# Patient Record
Sex: Female | Born: 1935 | Race: White | Hispanic: No | Smoking: Never smoker
Health system: Southern US, Community
[De-identification: ages and names within clinical notes are randomized; demographics above are authoritative.]

## PROBLEM LIST (undated history)

## (undated) ENCOUNTER — Inpatient Hospital Stay: Admission: EM | Payer: Self-pay | Source: Home / Self Care

## (undated) DIAGNOSIS — I509 Heart failure, unspecified: Secondary | ICD-10-CM

## (undated) DIAGNOSIS — K219 Gastro-esophageal reflux disease without esophagitis: Secondary | ICD-10-CM

## (undated) DIAGNOSIS — I341 Nonrheumatic mitral (valve) prolapse: Secondary | ICD-10-CM

## (undated) DIAGNOSIS — K5790 Diverticulosis of intestine, part unspecified, without perforation or abscess without bleeding: Secondary | ICD-10-CM

## (undated) DIAGNOSIS — IMO0002 Reserved for concepts with insufficient information to code with codable children: Secondary | ICD-10-CM

## (undated) DIAGNOSIS — R600 Localized edema: Secondary | ICD-10-CM

## (undated) DIAGNOSIS — F32A Depression, unspecified: Secondary | ICD-10-CM

## (undated) DIAGNOSIS — Z9289 Personal history of other medical treatment: Secondary | ICD-10-CM

## (undated) DIAGNOSIS — M1990 Unspecified osteoarthritis, unspecified site: Secondary | ICD-10-CM

## (undated) DIAGNOSIS — Z9981 Dependence on supplemental oxygen: Secondary | ICD-10-CM

## (undated) DIAGNOSIS — H3530 Unspecified macular degeneration: Secondary | ICD-10-CM

## (undated) DIAGNOSIS — N1330 Unspecified hydronephrosis: Secondary | ICD-10-CM

## (undated) DIAGNOSIS — G4730 Sleep apnea, unspecified: Secondary | ICD-10-CM

## (undated) DIAGNOSIS — R0602 Shortness of breath: Secondary | ICD-10-CM

## (undated) DIAGNOSIS — D649 Anemia, unspecified: Secondary | ICD-10-CM

## (undated) DIAGNOSIS — Z950 Presence of cardiac pacemaker: Secondary | ICD-10-CM

## (undated) DIAGNOSIS — F329 Major depressive disorder, single episode, unspecified: Secondary | ICD-10-CM

## (undated) DIAGNOSIS — I4891 Unspecified atrial fibrillation: Secondary | ICD-10-CM

## (undated) DIAGNOSIS — H269 Unspecified cataract: Secondary | ICD-10-CM

## (undated) HISTORY — PX: CATARACT EXTRACTION W/ INTRAOCULAR LENS IMPLANT: SHX1309

## (undated) HISTORY — DX: Unspecified cataract: H26.9

## (undated) HISTORY — DX: Depression, unspecified: F32.A

## (undated) HISTORY — DX: Reserved for concepts with insufficient information to code with codable children: IMO0002

## (undated) HISTORY — DX: Gastro-esophageal reflux disease without esophagitis: K21.9

## (undated) HISTORY — DX: Anemia, unspecified: D64.9

## (undated) HISTORY — PX: TONSILLECTOMY: SUR1361

## (undated) HISTORY — DX: Diverticulosis of intestine, part unspecified, without perforation or abscess without bleeding: K57.90

## (undated) HISTORY — DX: Unspecified osteoarthritis, unspecified site: M19.90

## (undated) HISTORY — DX: Major depressive disorder, single episode, unspecified: F32.9

## (undated) HISTORY — DX: Unspecified macular degeneration: H35.30

## (undated) HISTORY — DX: Unspecified atrial fibrillation: I48.91

## (undated) HISTORY — DX: Heart failure, unspecified: I50.9

## (undated) HISTORY — DX: Nonrheumatic mitral (valve) prolapse: I34.1

---

## 2002-08-13 DIAGNOSIS — I4891 Unspecified atrial fibrillation: Secondary | ICD-10-CM

## 2002-08-13 HISTORY — DX: Unspecified atrial fibrillation: I48.91

## 2006-08-13 HISTORY — PX: PACEMAKER INSERTION: SHX728

## 2006-08-13 HISTORY — PX: KNEE ARTHROSCOPY: SUR90

## 2006-08-13 HISTORY — PX: JOINT REPLACEMENT: SHX530

## 2009-08-13 HISTORY — PX: CARDIAC VALVE REPLACEMENT: SHX585

## 2012-02-19 ENCOUNTER — Telehealth: Payer: Self-pay | Admitting: Gastroenterology

## 2012-02-19 ENCOUNTER — Other Ambulatory Visit (INDEPENDENT_AMBULATORY_CARE_PROVIDER_SITE_OTHER): Payer: Medicare Other

## 2012-02-19 ENCOUNTER — Ambulatory Visit (INDEPENDENT_AMBULATORY_CARE_PROVIDER_SITE_OTHER): Payer: Medicare Other | Admitting: Gastroenterology

## 2012-02-19 ENCOUNTER — Encounter: Payer: Self-pay | Admitting: Gastroenterology

## 2012-02-19 VITALS — BP 116/70 | HR 84 | Ht 62.0 in | Wt 214.2 lb

## 2012-02-19 DIAGNOSIS — Z5181 Encounter for therapeutic drug level monitoring: Secondary | ICD-10-CM

## 2012-02-19 DIAGNOSIS — Z952 Presence of prosthetic heart valve: Secondary | ICD-10-CM

## 2012-02-19 DIAGNOSIS — K649 Unspecified hemorrhoids: Secondary | ICD-10-CM

## 2012-02-19 DIAGNOSIS — I509 Heart failure, unspecified: Secondary | ICD-10-CM

## 2012-02-19 DIAGNOSIS — Z954 Presence of other heart-valve replacement: Secondary | ICD-10-CM

## 2012-02-19 DIAGNOSIS — T50904A Poisoning by unspecified drugs, medicaments and biological substances, undetermined, initial encounter: Secondary | ICD-10-CM

## 2012-02-19 DIAGNOSIS — D649 Anemia, unspecified: Secondary | ICD-10-CM

## 2012-02-19 DIAGNOSIS — T50905A Adverse effect of unspecified drugs, medicaments and biological substances, initial encounter: Secondary | ICD-10-CM

## 2012-02-19 DIAGNOSIS — Z8719 Personal history of other diseases of the digestive system: Secondary | ICD-10-CM

## 2012-02-19 DIAGNOSIS — R6889 Other general symptoms and signs: Secondary | ICD-10-CM

## 2012-02-19 DIAGNOSIS — Z7901 Long term (current) use of anticoagulants: Secondary | ICD-10-CM

## 2012-02-19 DIAGNOSIS — K625 Hemorrhage of anus and rectum: Secondary | ICD-10-CM

## 2012-02-19 DIAGNOSIS — I4891 Unspecified atrial fibrillation: Secondary | ICD-10-CM

## 2012-02-19 DIAGNOSIS — R1013 Epigastric pain: Secondary | ICD-10-CM

## 2012-02-19 LAB — IBC PANEL
Iron: 37 ug/dL — ABNORMAL LOW (ref 42–145)
Saturation Ratios: 7.8 % — ABNORMAL LOW (ref 20.0–50.0)
Transferrin: 338.4 mg/dL (ref 212.0–360.0)

## 2012-02-19 LAB — VITAMIN B12: Vitamin B-12: 621 pg/mL (ref 211–911)

## 2012-02-19 MED ORDER — ESOMEPRAZOLE MAGNESIUM 40 MG PO CPDR: 40.0000 mg | DELAYED_RELEASE_CAPSULE | Freq: Every day | ORAL | Status: DC

## 2012-02-19 NOTE — Patient Instructions (Addendum)
Your physician has requested that you go to the basement for the following lab work before leaving today:IBC, Ferritin, Folic Acid, Vitamin B12.  Stop taking Prilosec and start Nexium samples one tablet by mouth once daily. A prescription has been sent to your pharmacy. \ Start taking a fiber supplement such as Metamucil daily which is over the counter.    High Fiber Diet A high fiber diet changes your normal diet to include more whole grains, legumes, fruits, and vegetables. Changes in the diet involve replacing refined carbohydrates with unrefined foods. The calorie level of the diet is essentially unchanged. The Dietary Reference Intake (recommended amount) for adult males is 38 g per day. For adult females, it is 25 g per day. Pregnant and lactating women should consume 28 g of fiber per day. Fiber is the intact part of a plant that is not broken down during digestion. Functional fiber is fiber that has been isolated from the plant to provide a beneficial effect in the body. PURPOSE  Increase stool bulk.   Ease and regulate bowel movements.   Lower cholesterol.  INDICATIONS THAT YOU NEED MORE FIBER  Constipation and hemorrhoids.   Uncomplicated diverticulosis (intestine condition) and irritable bowel syndrome.   Weight management.   As a protective measure against hardening of the arteries (atherosclerosis), diabetes, and cancer.  NOTE OF CAUTION If you have a digestive or bowel problem, ask your caregiver for advice before adding high fiber foods to your diet. Some of the following medical problems are such that a high fiber diet should not be used without consulting your caregiver:  Acute diverticulitis (intestine infection).   Partial small bowel obstructions.   Complicated diverticular disease involving bleeding, rupture (perforation), or abscess (boil, furuncle).   Presence of autonomic neuropathy (nerve damage) or gastric paresis (stomach cannot empty itself).  GUIDELINES  FOR INCREASING FIBER  Start adding fiber to the diet slowly. A gradual increase of about 5 more grams (2 slices of whole-wheat bread, 2 servings of most fruits or vegetables, or 1 bowl of high fiber cereal) per day is best. Too rapid an increase in fiber may result in constipation, flatulence, and bloating.   Drink enough water and fluids to keep your urine clear or pale yellow. Water, juice, or caffeine-free drinks are recommended. Not drinking enough fluid may cause constipation.   Eat a variety of high fiber foods rather than one type of fiber.   Try to increase your intake of fiber through using high fiber foods rather than fiber pills or supplements that contain small amounts of fiber.   The goal is to change the types of food eaten. Do not supplement your present diet with high fiber foods, but replace foods in your present diet.  INCLUDE A VARIETY OF FIBER SOURCES  Replace refined and processed grains with whole grains, canned fruits with fresh fruits, and incorporate other fiber sources. White rice, white breads, and most bakery goods contain little or no fiber.   Brown whole-grain rice, buckwheat oats, and many fruits and vegetables are all good sources of fiber. These include: broccoli, Brussels sprouts, cabbage, cauliflower, beets, sweet potatoes, white potatoes (skin on), carrots, tomatoes, eggplant, squash, berries, fresh fruits, and dried fruits.   Cereals appear to be the richest source of fiber. Cereal fiber is found in whole grains and bran. Bran is the fiber-rich outer coat of cereal grain, which is largely removed in refining. In whole-grain cereals, the bran remains. In breakfast cereals, the largest amount of fiber  is found in those with "bran" in their names. The fiber content is sometimes indicated on the label.   You may need to include additional fruits and vegetables each day.   In baking, for 1 cup white flour, you may use the following substitutions:   1 cup  whole-wheat flour minus 2 tbs.    cup white flour plus  cup whole-wheat flour.  Document Released: 07/30/2005 Document Revised: 07/19/2011 Document Reviewed: 06/07/2009 [facility] Patient Information 2012 [place], [place].   cc: Philemon Kingdom, MD

## 2012-02-19 NOTE — Progress Notes (Signed)
History of Present Illness:  This is a very nice 78 year old Caucasian female accompanied by her daughter for her interview and exam. She's very complex patient with multiple cardiovascular issues, currently in uncontrolled CHF from atrial fibrillation. She is on multiple cardiac medications and multiple anticoagulants. She is referred because of some bright red blood per rectum earlier this month without rectal pain or abdominal pain. She has mild low-grade nausea related to her multiple medications, and apparently is on chronic PPI therapy for chronic acid reflux. We do not have many records for review, and she previously was cared for in [facility]. I have reviewed her medications and records otherwise in detail. The patient and her daughter do relate that she has had previous diverticulitis several years ago. She continues with lower nominal gas, bloating, bowel or regularity, and mild dyspepsia without any previous history of known peptic ulcer disease or H. pylori infection. She denies use of other NSAIDs, but does use when necessary tramadol.  She had a colonoscopy 10 years ago which apparently was unremarkable, but apparently she had a very poor prep, and this resulted in her going into atrial fibrillation. She's had a mechanical heart valve replaced at [facility] in 2008, and is followed by cardiology and aspirin [place]. As part of her recent labs she was noted to have a decreased her hemoglobin from 11.1 the 10.5. The patient currently is on iron replacement therapy, Colace, Ultram, and Anusol-HC rectal cream. She does tend to have some mild constipation with gas and bloating. There been no melanotic stools, emesis, hematemesis, or worsening dyspepsia or dysphagia.  I have reviewed this patient's present history, medical and surgical past history, allergies and medications.     ROS: The remainder of the 10 point ROS is negative... chronic congestive heart failure with  marked limitations in her ambulation, and she can barely walk to the car without being short of breath.     Physical Exam: Elderly appearing patient in no acute distress. Blood pressure 116/70, pulse 84 and irregular, and weight 214 pounds the BMI of 39.19. General well developed well nourished patient in no acute distress,  Eyes PERRLA, no icterus, fundoscopic exam per opthamologist Skin no lesions noted Neck supple, no adenopathy, no thyroid enlargement, no tenderness Chest clear to percussion and auscultation Heart no significant murmurs, gallops or rubs noted.. irregularly irregular heartbeat noted. Abdomen no hepatosplenomegaly masses or tenderness, BS normal. Obese abdomen noted. Rectal inspection normal no fissures, or fistulae noted.  No masses or tenderness on digital exam. Stool guaiac negative. External hemorrhoids present with stigmata of recent bleeding posteriorly with a superficial black blood clot. Rectal exam otherwise negative with guaiac-negative stool. Extremities no acute joint lesions, edema, phlebitis or evidence of cellulitis... +1 pitting edema bilaterally noted. Neurologic patient oriented x 3, cranial nerves intact, no focal neurologic deficits noted. Psychological mental status normal and normal affect. Mild memory impairment obvious.  Assessment and plan: Severely ill patient from a cardiovascular standpoint,  not a candidate for endoscopic procedures at this time. She appears to have had hemorrhoidal bleeding, and I suspect she has diverticulosis coli with associated constipation, gas and bloating. She is chronically on PPI therapy, and has mild dyspepsia. I have changed her to Nexium 40 mg a day because of her high risk of upper GI bleeding with aspirin and other anticoagulants, have placed her on high fiber diet with daily Metamucil, and we will continue local hemorrhoidal creams. If she has further bleeding, she will  probably need CT colonoscopy once her  cardiovascular status and her atrial fibrillation has improved. As mentioned above she has a mechanical heart valve in place. I did order repeat CBC and anemia profile. Complex case with multiple problems and decision making and chart review.  Encounter Diagnoses  Name Primary?  . Rectal bleeding Yes  . Hemorrhoids   . Other general symptoms

## 2012-02-20 MED ORDER — LANSOPRAZOLE 30 MG PO CPDR: 30.0000 mg | DELAYED_RELEASE_CAPSULE | Freq: Every day | ORAL | Status: DC

## 2012-02-20 NOTE — Addendum Note (Signed)
Addended by: Annett Fabian on: 02/20/2012 05:26 PM   Modules accepted: Orders

## 2012-02-20 NOTE — Telephone Encounter (Signed)
Patient is in the donut hole and is having to pay for her Nexium.  Her daughter will contact her plan and the pharmacy to see what PPi is the cheapest for her and call back

## 2012-02-20 NOTE — Telephone Encounter (Signed)
Patient's daughter called back.  I have called in lansoprazole.  She will call back for a 3 month supply if this controls her symptoms

## 2012-02-21 LAB — FERRITIN: Ferritin: 54.2 ng/mL (ref 10.0–291.0)

## 2012-02-22 ENCOUNTER — Other Ambulatory Visit: Payer: Self-pay | Admitting: *Deleted

## 2012-02-22 ENCOUNTER — Other Ambulatory Visit: Payer: Self-pay

## 2012-02-22 MED ORDER — FERROUS SULFATE 325 (65 FE) MG PO TABS: 325.0000 mg | ORAL_TABLET | Freq: Every day | ORAL | Status: DC

## 2012-02-22 MED ORDER — FERROUS FUM-IRON POLYSACCH-FA 162-115.2-1 MG PO CAPS: 1.0000 | ORAL_CAPSULE | Freq: Every day | ORAL | Status: DC

## 2012-02-25 ENCOUNTER — Telehealth: Payer: Self-pay | Admitting: Gastroenterology

## 2012-02-25 NOTE — Telephone Encounter (Signed)
Daughter called to report only ferrous sulfate was ordered and pt has been taking 2 daily. Called [facility] and they had no record of my order; gave a verbal order to [facility] for Tandem; they never received my order. Informed daughter.

## 2012-06-13 ENCOUNTER — Telehealth: Payer: Self-pay | Admitting: *Deleted

## 2012-06-13 DIAGNOSIS — D649 Anemia, unspecified: Secondary | ICD-10-CM

## 2012-06-13 NOTE — Telephone Encounter (Signed)
Message copied by Florene Glen on Fri Jun 13, 2012  1:50 PM ------      Message from: Florene Glen      Created: Fri Feb 22, 2012 11:11 AM       Ck labs in 3 months for response to tandem

## 2012-06-13 NOTE — Telephone Encounter (Signed)
Informed pt she needs to repeat her labs since she has been taking Tandem. Gave pt the lab location and hours and she will hopefully come next week.

## 2012-06-17 ENCOUNTER — Telehealth: Payer: Self-pay | Admitting: Gastroenterology

## 2012-06-17 NOTE — Telephone Encounter (Signed)
Informed pt she does not need any more labs per Dr Jarold Motto; pt stated understanding.

## 2012-06-17 NOTE — Telephone Encounter (Signed)
Dr Jarold Motto, I had called pt a couple of weeks ago to repeat labs after Tandem use of 3 months. I had labs faxed from PCP and CBC and CMET is all that was drawn. I do not have a Baseline CBC. Are these labs enough or do you still wants an Iron Panel done? Thanks.   Lab results are on your desk. Thanks.

## 2012-06-17 NOTE — Telephone Encounter (Signed)
Pt report she had her labs done recently at her PCP and the doctor stated everything was fine. Called Dr Rayfield [facility] in [place], [phone], and they will fax the results.

## 2012-06-17 NOTE — Telephone Encounter (Signed)
Labs are fine does not need further blood work.

## 2012-08-13 DIAGNOSIS — D649 Anemia, unspecified: Secondary | ICD-10-CM

## 2012-08-13 HISTORY — DX: Anemia, unspecified: D64.9

## 2014-01-20 ENCOUNTER — Other Ambulatory Visit: Payer: Self-pay | Admitting: Urology

## 2014-01-21 ENCOUNTER — Encounter (HOSPITAL_COMMUNITY): Payer: Self-pay | Admitting: Pharmacy Technician

## 2014-01-21 ENCOUNTER — Other Ambulatory Visit: Payer: Self-pay | Admitting: Urology

## 2014-01-21 DIAGNOSIS — N135 Crossing vessel and stricture of ureter without hydronephrosis: Secondary | ICD-10-CM

## 2014-01-21 DIAGNOSIS — R591 Generalized enlarged lymph nodes: Secondary | ICD-10-CM

## 2014-01-25 ENCOUNTER — Other Ambulatory Visit: Payer: Self-pay | Admitting: Radiology

## 2014-01-25 ENCOUNTER — Other Ambulatory Visit (HOSPITAL_COMMUNITY): Payer: Self-pay | Admitting: Urology

## 2014-01-25 ENCOUNTER — Encounter (HOSPITAL_COMMUNITY): Payer: Self-pay

## 2014-01-25 DIAGNOSIS — R600 Localized edema: Secondary | ICD-10-CM

## 2014-01-25 HISTORY — DX: Localized edema: R60.0

## 2014-01-25 NOTE — Patient Instructions (Addendum)
Your procedure is scheduled on:  01/28/14  THURSDAY  Report to [place] at    0700   AM.   Call this number if you have problems the morning of surgery: [place]  Do not eat food  Or drink :After Midnight. WEDNESDAY   Take these medicines the morning of surgery with A SIP OF WATER:AMIODARONE, LEXAPRO, NEXIUM, MIRAPEX May take NORCO/ ATIVAN/ TRAMADOL IF NEEDED(not all at the same time!)   .  Contacts, dentures or partial plates, or metal hairpins  can not be worn to surgery. Your family will be responsible for glasses, dentures, hearing aides while you are in surgery  Leave suitcase in the car. After surgery it may be brought to your room.  For patients admitted to the hospital, checkout time is 11:00 AM day of  discharge.         [facility] IS NOT RESPONSIBLE FOR ANY VALUABLES                                                                  [facility] - Preparing for Surgery Before surgery, you can play an important role.  Because skin is not sterile, your skin needs to be as free of germs as possible.  You can reduce the number of germs on your skin by washing with CHG (chlorahexidine gluconate) soap before surgery.  CHG is an antiseptic cleaner which kills germs and bonds with the skin to continue killing germs even after washing. Please DO NOT use if you have an allergy to CHG or antibacterial soaps.  If your skin becomes reddened/irritated stop using the CHG and inform your nurse when you arrive at Short Stay. Do not shave (including legs and underarms) for at least 48 hours prior to the first CHG shower.  You may shave your face/neck. Please follow these instructions carefully:  1.  Shower with CHG Soap the night before surgery and the  morning of Surgery.  2.  If you choose to wash your hair, wash your hair first as usual with your  normal   shampoo.  3.  After you shampoo, rinse your hair and body thoroughly to remove the  shampoo.                           4.  Use CHG as you would any other liquid soap.  You can apply chg directly  to the skin and wash                       Gently with a scrungie or clean washcloth.  5.  Apply the CHG Soap to your body ONLY FROM THE NECK DOWN.   Do not use on face/ open                           Wound or open sores. Avoid contact with eyes, ears mouth and genitals (private parts).  Wash face,  Genitals (private parts) with your normal soap.             6.  Wash thoroughly, paying special attention to the area where your surgery  will be performed.  7.  Thoroughly rinse your body with warm water from the neck down.  8.  DO NOT shower/wash with your normal soap after using and rinsing off  the CHG Soap.                9.  Pat yourself dry with a clean towel.            10.  Wear clean pajamas.            11.  Place clean sheets on your bed the night of your first shower and do not  sleep with pets. Day of Surgery : Do not apply any lotions/deodorants the morning of surgery.  Please wear clean clothes to the hospital/surgery center.  FAILURE TO FOLLOW THESE INSTRUCTIONS MAY RESULT IN THE CANCELLATION OF YOUR SURGERY PATIENT SIGNATURE_________________________________  NURSE SIGNATURE__________________________________  ________________________________________________________________________  PT NOTIFED SURGERY DATE MOVED TO Tuesday 02/02/14 - SHE SHOULD ARRIVE TO SHORT STAY BY 9:30 AM - SURGERY IS AT 11:30 AM.  ALL OTHER INSTRUCTIONS THE SAME - TAKE MEDICATIONS AS PREVIOUSLY INSTRUCTED AM OF SURGERY WITH FEW SIPS WATER.  PT VOICED UNDERSTANDING  PAT [facility] RN 01/29/14

## 2014-01-25 NOTE — Progress Notes (Signed)
LOV Dr Agustin Cree 3/15 chart, last pacer interrogation 5/15 chart, ekg 3/15 chart, eccho 4/15 chart

## 2014-01-25 NOTE — Progress Notes (Signed)
Lennette Bihari-  I will be seeing Ms Portner for pre op tomorrow in PST.  If you will place order for consent in epic before then, I will be happy to obtain this before the day of procedure following anesthesia   Thank you

## 2014-01-25 NOTE — Progress Notes (Signed)
Ov Dr Laqueta Due 5/15 on chart

## 2014-01-26 ENCOUNTER — Encounter (HOSPITAL_COMMUNITY): Payer: Self-pay

## 2014-01-26 ENCOUNTER — Inpatient Hospital Stay (HOSPITAL_COMMUNITY)
Admission: RE | Admit: 2014-01-26 | Discharge: 2014-01-26 | Disposition: A | Discharge status: Home / Self Care | Payer: Medicare Other | Source: Ambulatory Visit

## 2014-01-26 ENCOUNTER — Ambulatory Visit (HOSPITAL_COMMUNITY)
Admission: RE | Admit: 2014-01-26 | Discharge: 2014-01-26 | Disposition: A | Discharge status: Home / Self Care | Payer: Medicare Other | Source: Ambulatory Visit | Attending: Anesthesiology | Admitting: Anesthesiology

## 2014-01-26 ENCOUNTER — Encounter (HOSPITAL_COMMUNITY)
Admission: RE | Admit: 2014-01-26 | Discharge: 2014-01-26 | Disposition: A | Discharge status: Home / Self Care | Payer: Medicare Other | Source: Ambulatory Visit | Attending: Urology | Admitting: Urology

## 2014-01-26 DIAGNOSIS — Z01812 Encounter for preprocedural laboratory examination: Secondary | ICD-10-CM | POA: Insufficient documentation

## 2014-01-26 DIAGNOSIS — I4891 Unspecified atrial fibrillation: Secondary | ICD-10-CM | POA: Insufficient documentation

## 2014-01-26 DIAGNOSIS — Z950 Presence of cardiac pacemaker: Secondary | ICD-10-CM | POA: Insufficient documentation

## 2014-01-26 DIAGNOSIS — Z954 Presence of other heart-valve replacement: Secondary | ICD-10-CM | POA: Insufficient documentation

## 2014-01-26 DIAGNOSIS — Z01818 Encounter for other preprocedural examination: Secondary | ICD-10-CM | POA: Insufficient documentation

## 2014-01-26 HISTORY — DX: Shortness of breath: R06.02

## 2014-01-26 HISTORY — DX: Personal history of other medical treatment: Z92.89

## 2014-01-26 HISTORY — DX: Sleep apnea, unspecified: G47.30

## 2014-01-26 HISTORY — DX: Presence of cardiac pacemaker: Z95.0

## 2014-01-26 HISTORY — DX: Localized edema: R60.0

## 2014-01-26 HISTORY — DX: Unspecified hydronephrosis: N13.30

## 2014-01-26 LAB — CBC
HCT: 38.4 % (ref 36.0–46.0)
Hemoglobin: 12.1 g/dL (ref 12.0–15.0)
MCH: 28.3 pg (ref 26.0–34.0)
MCHC: 31.5 g/dL (ref 30.0–36.0)
MCV: 89.9 fL (ref 78.0–100.0)
PLATELETS: 220 10*3/uL (ref 150–400)
RBC: 4.27 MIL/uL (ref 3.87–5.11)
RDW: 14.7 % (ref 11.5–15.5)
WBC: 6.3 10*3/uL (ref 4.0–10.5)

## 2014-01-26 LAB — BASIC METABOLIC PANEL
BUN: 20 mg/dL (ref 6–23)
CALCIUM: 9.3 mg/dL (ref 8.4–10.5)
CO2: 28 meq/L (ref 19–32)
CREATININE: 1.35 mg/dL — AB (ref 0.50–1.10)
Chloride: 104 mEq/L (ref 96–112)
GFR calc non Af Amer: 36 mL/min — ABNORMAL LOW (ref >90)
GFR, EST AFRICAN AMERICAN: 41 mL/min — AB (ref >90)
Glucose, Bld: 95 mg/dL (ref 70–99)
Potassium: 4.4 mEq/L (ref 3.7–5.3)
SODIUM: 142 meq/L (ref 137–147)

## 2014-01-26 LAB — PROTIME-INR
INR: 3.98 — ABNORMAL HIGH (ref 0.00–1.49)
Prothrombin Time: 37.3 seconds — ABNORMAL HIGH (ref 11.6–15.2)

## 2014-01-26 LAB — APTT: APTT: 66 s — AB (ref 24–37)

## 2014-01-26 NOTE — H&P (Signed)
son For Visit Seen today for complaint of difficulty voiding and worsening (L) LE.   Active Problems Problems  1. Difficulty in urination (788.99) 2. Hydronephrosis, left (591)   Assessed By: Jimmey Ralph (Urology); Last Assessed: 19 Jan 2014 3. Lymphadenopathy (785.6)   Assessed By: Jimmey Ralph (Urology); Last Assessed: 19 Jan 2014 4. Ureteral obstruction (593.4)  History of Present Illness 80 YO female patient of Dr. Ralene Muskrat seen today for complaint of difficulty voiding and worsening (L) LE.     GU HX:     Last seen 01/12/14 for consultation by Dr. Laqueta Due for left hydronephrosis. She has a 2 month history of edema in the left leg and a CT was done to look for an intraabdominal cause and she was found to have changes along the left ureter that appeared inflammatory and there was obstruction of the left kidney that was not present on a prior scan in 2012. There was cortical thinning suggesting a long standing process and there were some enlarged node in the inguinal area. She has some fluid in the uterine cavity and has seen gynecology for attempted endometrial biopsy but this was unsuccessful due to severe cervical stenosis.  She has had hematuria. She began to have some pain in the left flank that was mild but has progressed over the last 2 weeks. She has a catch in the left groin. She has had no fever or night sweats.     Interval HX;   Today left leg is much more edematous and "tight". No weeping. States she has also noted a decrease in urine output but states that over past 36 hrs has not drank a lot of fluids and today has not taken Fursomeide due to "not being home."   Past Medical History Problems  1. History of Anxiety (300.00) 2. History of CPAP (continuous positive airway pressure) dependence (V46.8) 3. History of arthritis (V13.4) 4. History of atrial fibrillation (V12.59) 5. History of cardiac arrhythmia (V12.59) 6. History of cardiac murmur (V12.59) 7.  History of depression (V11.8) 8. History of esophageal reflux (V12.79) 9. History of heart failure (V12.59) 10. History of Obstructive sleep apnea, adult (327.23)  Surgical History Problems  1. History of Blepharoplasty Upper Lid W/ Excessive Skin 2. History of Cataract Surgery 3. History of Knee Replacement 4. History of Mitral Valve Replacement 5. History of Pacemaker Placement 6. History of Sinus Surgery 7. History of Tonsillectomy With Adenoidectomy  Current Meds 1. Anusol-HC 25 MG Rectal Suppository;  Therapy: (Recorded:02Jun2015) to Recorded 2. Coumadin TABS;  Therapy: (Recorded:02Jun2015) to Recorded 3. Hydrocodone-Acetaminophen TABS;  Therapy: (GSUPJSRP:59YVO5929) to Recorded 4. Iron TABS;  Therapy: (WKMQKMMN:81RRN1657) to Recorded 5. Lasix 40 MG Oral Tablet;  Therapy: (XUXYBFXO:32NVB1660) to Recorded 6. Lexapro 20 MG Oral Tablet;  Therapy: (AYOKHTXH:74FSE3953) to Recorded 7. Magnesium 500 MG Oral Tablet;  Therapy: (UYEBXIDH:68SHU8372) to Recorded 8. Mirapex 0.5 MG Oral Tablet;  Therapy: (BMSXJDBZ:20EYE2336) to Recorded 9. NexIUM 40 MG Oral Capsule Delayed Release;  Therapy: (PQAESLPN:30YFR1021) to Recorded 10. Pacerone 200 MG Oral Tablet;   Therapy: (RZNBVAPO:14DCV0131) to Recorded 11. Potassium Chloride 20 MEQ TBCR;   Therapy: (YHOOILNZ:97KQA0601) to Recorded 12. SEROquel 25 MG Oral Tablet;   Therapy: (VIFBPPHK:32XMD4709) to Recorded 13. Stool Softener TABS;   Therapy: (KHVFMBBU:03JQD6438) to Recorded 14. TraMADol HCl TABS;   Therapy: (VKFMMCRF:54HKG6770) to Recorded 15. Tylenol Arthritis Pain 650 MG Oral Tablet Extended Release;   Therapy: (HEKBTCYE:18HTM9311) to Recorded 16. Vitamin D3 CAPS;   Therapy: (Recorded:02Jun2015) to Recorded  Allergies Medication  1. Gabapentin CAPS  2. Wellbutrin TABS  Family History Problems  1. Family history of congestive heart failure (V17.49) : Mother 2. Family history of depression (V17.0) 3. Family history of  pancreatic cancer (V16.0) : Father  Social History Problems  1. Denied: History of Alcohol use 2. Caffeine use (V49.89) 3. Never a smoker 4. Number of children   3 daughters    2 sons 5. Retired 6. Widowed  Review of Systems Genitourinary, constitutional, skin, eye, otolaryngeal, hematologic/lymphatic, cardiovascular, pulmonary, endocrine, musculoskeletal, gastrointestinal, neurological and psychiatric system(s) were reviewed and pertinent findings if present are noted.  Genitourinary: oliguria.  Cardiovascular: leg swelling.    Vitals Vital Signs [Data Includes: Last 1 Day]  Recorded: 09Jun2015 01:30PM  Blood Pressure: 132 / 69 Temperature: 97.7 F Heart Rate: 81  Physical Exam Constitutional: Well nourished and well developed . No acute distress. The patient appears well hydrated.  LLE +3/4 left leg +2 right leg  Abdomen: The abdomen is obese. The abdomen is soft and nontender. No CVA tenderness.  Skin: Normal skin turgor and normal skin color and pigmentation.  Neuro/Psych:. Mood and affect are appropriate.    Results/Data Urine [Data Includes: Last 1 Day]   09Jun2015  COLOR YELLOW   APPEARANCE CLEAR   SPECIFIC GRAVITY 1.025   pH 5.5   GLUCOSE NEG mg/dL  BILIRUBIN NEG   KETONE TRACE mg/dL  BLOOD NEG   PROTEIN TRACE mg/dL  UROBILINOGEN 0.2 mg/dL  NITRITE NEG   LEUKOCYTE ESTERASE SMALL   SQUAMOUS EPITHELIAL/HPF MODERATE   WBC 0-2 WBC/hpf  RBC 0-2 RBC/hpf  BACTERIA MODERATE   CRYSTALS NONE SEEN   CASTS NONE SEEN   Other AMORPHOUS NOTED    The following clinical lab reports were reviewed:  UA- negative Stat BUN/creatine.  PVR: Ultrasound PVR 22 ml. Selected Results  BUN & CREATININE 09Jun2015 02:06PM Warden, Diane  SPECIMEN TYPE: BLOOD   Test Name Result Flag Reference  CREATININE 1.50 mg/dL H 0.50-1.40  BUN 23 mg/dL  6-23  Est GFR, African American 37 mL/min L   Est GFR, NonAfrican American 32 mL/min L   PERFORMED AT:        [facility]                       [address]                      [place], [place] [address]   THE ESTIMATED GFR IS A CALCULATION VALID FOR ADULTS (>=18 YEARS OLD) THAT USES THE CKD-EPI ALGORITHM TO ADJUST FOR AGE AND SEX. IT IS   NOT TO BE USED FOR CHILDREN, PREGNANT WOMEN, HOSPITALIZED PATIENTS,    PATIENTS ON DIALYSIS, OR WITH RAPIDLY CHANGING KIDNEY FUNCTION. ACCORDING TO THE NKDEP, EGFR >89 IS NORMAL, 60-89 SHOWS MILD IMPAIRMENT, 30-59 SHOWS MODERATE IMPAIRMENT, 15-29 SHOWS SEVERE IMPAIRMENT AND <15 IS ESRD.   Assessment Assessed  1. Lymphadenopathy (785.6) 2. Hydronephrosis, left (591) 3. Difficulty in urination (788.99) 4. Ureteral obstruction (593.4)  Plan  Difficulty in urination  1. PVR U/S; Status:Complete;   Done: 09Jun2015 Health Maintenance  2. UA With REFLEX; [Do Not Release]; Status:Complete;   Done: 09Jun2015 01:17PM Hydronephrosis, left, Lymphadenopathy  3. VENIPUNCTURE; Status:Complete;   Done: 09Jun2015  Stat BUN/creatine   Instructed to take diuretic daily and to keep leg elevated as much as possible  F/U next week with Dr. Wrenn to discuss proceeding with cysto/stent      BUN & CREATININE; Status:Resulted - Requires   Verification;  Done: [id number] 12:00AM  Due:11Jun2015; Marked Important;Ordered; Stat;   YQI:[id number], left, Lymphadenopathy; Ordered EP:PIRJJO, Diane;   Electronics engineer signed by : Jimmey Ralph, Trey Paula; Jan 19 2014  2:42PM EST

## 2014-01-27 ENCOUNTER — Other Ambulatory Visit: Payer: Self-pay | Admitting: Urology

## 2014-01-27 NOTE — Progress Notes (Signed)
Per epic, labs had been reviewed by K Allred PA 01/26/14

## 2014-01-27 NOTE — Progress Notes (Signed)
PST NOTE--- LATE ENTRY FOR 01/26/14  1600 Patient states she will be going to cardiologist office in [place] after she leaves here for lab draw and talk about anticoagulation.   I spoke with office at 1400 who stated they would send pacer orders, notes, lovonox instructions and more information regarding type of pacemaker, leads, company etc after her visit today.  01/28/14 1200- spoke with Dr Jennings Books office requesting info to be faxed from visit 01/26/14.  Stated patient was there now, and that her INR was even higher and she would not be able to have her surgery.  Office stated they would call [facility] and notify them.  1230  Left VM with Pam at Dr Denton Lank office clarififying she had received this.  1330 spoke with Pam who stated that surgery cancelled for tomorrow and will be rescheduled for 02/02/14.   Cardiology office stated they will handle her coagulation.

## 2014-01-28 ENCOUNTER — Other Ambulatory Visit: Payer: Self-pay | Admitting: Radiology

## 2014-01-28 ENCOUNTER — Ambulatory Visit (HOSPITAL_COMMUNITY): Admission: RE | Admit: 2014-01-28 | Payer: Medicare Other | Source: Ambulatory Visit | Admitting: Urology

## 2014-01-28 ENCOUNTER — Encounter (HOSPITAL_COMMUNITY): Admission: RE | Payer: Self-pay | Source: Ambulatory Visit

## 2014-01-28 ENCOUNTER — Ambulatory Visit (HOSPITAL_COMMUNITY): Payer: Medicare Other

## 2014-01-28 SURGERY — CYSTOURETEROSCOPY, WITH RETROGRADE PYELOGRAM AND STENT INSERTION
Anesthesia: Choice | Laterality: Left

## 2014-01-29 NOTE — Progress Notes (Signed)
I SPOKE WITH PT BY PHONE THIS AM - SHE STATES HER CARDIOLOGIST INSTRUCTED HER TO HOLD HER COUMADIN TODAY AND TOMORROW ( Friday AND Saturday ) AND GO TO ER Sunday FOR BLOOD DRAW AND IF 2 OR LOWER ( INR ) SHE WILL DO LOVENOX Sunday NIGHT AND Monday AM.  IF HER BLOOD IS OVER 2 - SHE WILL SEE HER HEART DOCTOR Monday AM.   PT'S SURGERY FOR 6/18 WAS CANCELLED AND SHE WAS PUT ON FOR SAME SURGERY ON Tuesday 02/02/14.  I INSTRUCTED PT TO ARRIVE TO SHORT STAY AT 9:30 AM Tuesday - SURGERY TIME IS 11:30 AM. PT REMINDED NOTHING TO EAT OR DRINK AFTER MIDNIGHT THE NIGHT BEFORE HER SURGERY - EXCEPT FEW SIPS OF WATER TO TAKE THE MEDICATIONS AS PER INSTRUCTIONS SHE RECEIVED AT [facility] APPOINTMENT 6/16 AT [facility].  PT WAS ASKED TO BE SURE TO LET NURSE KNOW AM OF SURGERY IF SHE DID TAKE LOVENOX AND WHAT DOSAGE - SO NURSE COULD RECORD IN MEDICATION RECORD.  PT REMINDED TO FOLLOW ALL OTHER PREOP INSTRUCTIONS GIVEN ON 6/16 - SHE STILL HAS THE PAPERS. 3RD REQUEST WAS MADE TO PT'S CARDIOLOGIST'S OFFICE FOR PACEMAKER ORDERS TO BE COMPLETED AND FAXED BACK.  HER OFFICE NOTE FROM HER VISIT 01/26/14 WITH CARDIOLOGIST REQUESTED.

## 2014-02-01 ENCOUNTER — Other Ambulatory Visit: Payer: Self-pay | Admitting: Radiology

## 2014-02-02 ENCOUNTER — Encounter (HOSPITAL_COMMUNITY): Payer: Medicare Other | Admitting: Anesthesiology

## 2014-02-02 ENCOUNTER — Encounter (HOSPITAL_COMMUNITY): Payer: Self-pay | Admitting: *Deleted

## 2014-02-02 ENCOUNTER — Encounter (HOSPITAL_COMMUNITY): Payer: Self-pay

## 2014-02-02 ENCOUNTER — Encounter (HOSPITAL_COMMUNITY): Payer: Self-pay | Admitting: Anesthesiology

## 2014-02-02 ENCOUNTER — Ambulatory Visit (HOSPITAL_COMMUNITY): Payer: Medicare Other | Admitting: Anesthesiology

## 2014-02-02 ENCOUNTER — Encounter (HOSPITAL_COMMUNITY)
Admission: RE | Disposition: A | Discharge status: Home / Self Care | Payer: Self-pay | Source: Ambulatory Visit | Attending: Urology

## 2014-02-02 ENCOUNTER — Ambulatory Visit (HOSPITAL_COMMUNITY)
Admission: RE | Admit: 2014-02-02 | Discharge: 2014-02-02 | Disposition: A | Discharge status: Home / Self Care | Payer: Medicare Other | Source: Ambulatory Visit | Attending: Urology | Admitting: Urology

## 2014-02-02 VITALS — BP 120/54 | HR 65 | Temp 97.3°F | Resp 14

## 2014-02-02 DIAGNOSIS — G4733 Obstructive sleep apnea (adult) (pediatric): Secondary | ICD-10-CM | POA: Insufficient documentation

## 2014-02-02 DIAGNOSIS — N1330 Unspecified hydronephrosis: Secondary | ICD-10-CM | POA: Insufficient documentation

## 2014-02-02 DIAGNOSIS — K5730 Diverticulosis of large intestine without perforation or abscess without bleeding: Secondary | ICD-10-CM | POA: Insufficient documentation

## 2014-02-02 DIAGNOSIS — R591 Generalized enlarged lymph nodes: Secondary | ICD-10-CM

## 2014-02-02 DIAGNOSIS — F329 Major depressive disorder, single episode, unspecified: Secondary | ICD-10-CM | POA: Insufficient documentation

## 2014-02-02 DIAGNOSIS — I499 Cardiac arrhythmia, unspecified: Secondary | ICD-10-CM | POA: Insufficient documentation

## 2014-02-02 DIAGNOSIS — N135 Crossing vessel and stricture of ureter without hydronephrosis: Secondary | ICD-10-CM | POA: Diagnosis present

## 2014-02-02 DIAGNOSIS — Z950 Presence of cardiac pacemaker: Secondary | ICD-10-CM | POA: Insufficient documentation

## 2014-02-02 DIAGNOSIS — D649 Anemia, unspecified: Secondary | ICD-10-CM | POA: Insufficient documentation

## 2014-02-02 DIAGNOSIS — I509 Heart failure, unspecified: Secondary | ICD-10-CM | POA: Insufficient documentation

## 2014-02-02 DIAGNOSIS — R3989 Other symptoms and signs involving the genitourinary system: Secondary | ICD-10-CM | POA: Insufficient documentation

## 2014-02-02 DIAGNOSIS — R599 Enlarged lymph nodes, unspecified: Secondary | ICD-10-CM | POA: Insufficient documentation

## 2014-02-02 DIAGNOSIS — K219 Gastro-esophageal reflux disease without esophagitis: Secondary | ICD-10-CM | POA: Insufficient documentation

## 2014-02-02 DIAGNOSIS — C779 Secondary and unspecified malignant neoplasm of lymph node, unspecified: Secondary | ICD-10-CM | POA: Insufficient documentation

## 2014-02-02 DIAGNOSIS — Z79899 Other long term (current) drug therapy: Secondary | ICD-10-CM | POA: Insufficient documentation

## 2014-02-02 DIAGNOSIS — R319 Hematuria, unspecified: Secondary | ICD-10-CM | POA: Insufficient documentation

## 2014-02-02 DIAGNOSIS — F3289 Other specified depressive episodes: Secondary | ICD-10-CM | POA: Insufficient documentation

## 2014-02-02 DIAGNOSIS — I4891 Unspecified atrial fibrillation: Secondary | ICD-10-CM | POA: Insufficient documentation

## 2014-02-02 DIAGNOSIS — F411 Generalized anxiety disorder: Secondary | ICD-10-CM | POA: Insufficient documentation

## 2014-02-02 DIAGNOSIS — N289 Disorder of kidney and ureter, unspecified: Secondary | ICD-10-CM | POA: Insufficient documentation

## 2014-02-02 HISTORY — PX: CYSTOSCOPY WITH RETROGRADE PYELOGRAM, URETEROSCOPY AND STENT PLACEMENT: SHX5789

## 2014-02-02 LAB — PROTIME-INR
INR: 1.22 (ref 0.00–1.49)
Prothrombin Time: 15.1 seconds (ref 11.6–15.2)

## 2014-02-02 SURGERY — CYSTOURETEROSCOPY, WITH RETROGRADE PYELOGRAM AND STENT INSERTION
Anesthesia: General | Site: Ureter | Laterality: Left

## 2014-02-02 MED ORDER — MIDAZOLAM HCL 2 MG/2ML IJ SOLN
INTRAMUSCULAR | Status: AC | PRN
  Administered 2014-02-02: 2 mg via INTRAVENOUS

## 2014-02-02 MED ORDER — SODIUM CHLORIDE 0.9 % IR SOLN
Status: DC | PRN
  Administered 2014-02-02: 3000 mL

## 2014-02-02 MED ORDER — LIDOCAINE HCL (CARDIAC) 20 MG/ML IV SOLN
INTRAVENOUS | Status: DC | PRN
  Administered 2014-02-02: 30 mg via INTRAVENOUS

## 2014-02-02 MED ORDER — FENTANYL CITRATE 0.05 MG/ML IJ SOLN
INTRAMUSCULAR | Status: AC
  Filled 2014-02-02: qty 2

## 2014-02-02 MED ORDER — ACETAMINOPHEN 325 MG PO TABS: 650.0000 mg | ORAL_TABLET | ORAL | Status: DC | PRN

## 2014-02-02 MED ORDER — SODIUM CHLORIDE 0.9 % IV SOLN: 250.0000 mL | INTRAVENOUS | Status: DC | PRN

## 2014-02-02 MED ORDER — CIPROFLOXACIN IN D5W 400 MG/200ML IV SOLN
400.0000 mg | INTRAVENOUS | Status: AC
  Administered 2014-02-02: 400 mg via INTRAVENOUS

## 2014-02-02 MED ORDER — SODIUM CHLORIDE 0.9 % IV SOLN: INTRAVENOUS | Status: DC

## 2014-02-02 MED ORDER — PROPOFOL 10 MG/ML IV BOLUS
INTRAVENOUS | Status: AC
  Filled 2014-02-02: qty 20

## 2014-02-02 MED ORDER — IOHEXOL 300 MG/ML  SOLN
INTRAMUSCULAR | Status: DC | PRN
  Administered 2014-02-02: 4 mL via URETHRAL

## 2014-02-02 MED ORDER — SODIUM CHLORIDE 0.9 % IJ SOLN: 3.0000 mL | Freq: Two times a day (BID) | INTRAMUSCULAR | Status: DC

## 2014-02-02 MED ORDER — FENTANYL CITRATE 0.05 MG/ML IJ SOLN
INTRAMUSCULAR | Status: AC | PRN
  Administered 2014-02-02: 100 ug via INTRAVENOUS

## 2014-02-02 MED ORDER — LACTATED RINGERS IV SOLN
INTRAVENOUS | Status: DC
  Administered 2014-02-02: 1000 mL via INTRAVENOUS

## 2014-02-02 MED ORDER — OXYCODONE HCL 5 MG PO TABS: 5.0000 mg | ORAL_TABLET | ORAL | Status: DC | PRN

## 2014-02-02 MED ORDER — FENTANYL CITRATE 0.05 MG/ML IJ SOLN: 25.0000 ug | INTRAMUSCULAR | Status: DC | PRN

## 2014-02-02 MED ORDER — NALOXONE HCL 0.4 MG/ML IJ SOLN
INTRAMUSCULAR | Status: AC | PRN
  Administered 2014-02-02: 0.2 mg via INTRAVENOUS

## 2014-02-02 MED ORDER — FENTANYL CITRATE 0.05 MG/ML IJ SOLN
INTRAMUSCULAR | Status: AC
  Filled 2014-02-02: qty 4

## 2014-02-02 MED ORDER — ACETAMINOPHEN 650 MG RE SUPP
650.0000 mg | RECTAL | Status: DC | PRN
  Filled 2014-02-02: qty 1

## 2014-02-02 MED ORDER — PROPOFOL 10 MG/ML IV BOLUS
INTRAVENOUS | Status: DC | PRN
  Administered 2014-02-02 (×2): 25 mg via INTRAVENOUS
  Administered 2014-02-02: 175 mg via INTRAVENOUS

## 2014-02-02 MED ORDER — LACTATED RINGERS IV SOLN
INTRAVENOUS | Status: DC | PRN
  Administered 2014-02-02: 11:00:00 via INTRAVENOUS

## 2014-02-02 MED ORDER — MIDAZOLAM HCL 2 MG/2ML IJ SOLN
INTRAMUSCULAR | Status: AC
  Filled 2014-02-02: qty 4

## 2014-02-02 MED ORDER — 0.9 % SODIUM CHLORIDE (POUR BTL) OPTIME
TOPICAL | Status: DC | PRN
  Administered 2014-02-02: 1000 mL

## 2014-02-02 MED ORDER — LIDOCAINE HCL (CARDIAC) 20 MG/ML IV SOLN
INTRAVENOUS | Status: AC
  Filled 2014-02-02: qty 5

## 2014-02-02 MED ORDER — SODIUM CHLORIDE 0.9 % IJ SOLN: 3.0000 mL | INTRAMUSCULAR | Status: DC | PRN

## 2014-02-02 SURGICAL SUPPLY — 14 items
BAG URO CATCHER STRL LF (DRAPE) ×3 IMPLANT
CATH URET 5FR 28IN OPEN ENDED (CATHETERS) IMPLANT
CLOTH BEACON ORANGE TIMEOUT ST (SAFETY) ×3 IMPLANT
DRAPE CAMERA CLOSED 9X96 (DRAPES) ×3 IMPLANT
GLOVE SURG SS PI 7.0 STRL IVOR (GLOVE) ×3 IMPLANT
GLOVE SURG SS PI 8.0 STRL IVOR (GLOVE) IMPLANT
GOWN STRL REUS W/TWL XL LVL3 (GOWN DISPOSABLE) ×6 IMPLANT
GUIDEWIRE STR DUAL SENSOR (WIRE) ×3 IMPLANT
MANIFOLD NEPTUNE II (INSTRUMENTS) ×3 IMPLANT
PACK CYSTO (CUSTOM PROCEDURE TRAY) ×3 IMPLANT
STENT CONTOUR 6FRX24X.038 (STENTS) ×3 IMPLANT
STENT PERCUFLEX 4.8FRX28 (STENTS) ×3 IMPLANT
TUBING CONNECTING 10 (TUBING) ×2 IMPLANT
TUBING CONNECTING 10' (TUBING) ×1

## 2014-02-02 NOTE — Anesthesia Preprocedure Evaluation (Signed)
Anesthesia Evaluation  Patient identified by MRN, date of birth, ID band Patient awake    Reviewed: Allergy & Precautions, H&P , NPO status , Patient's Chart, lab work & pertinent test results  Airway Mallampati: II TM Distance: >3 FB Neck ROM: Full    Dental no notable dental hx.    Pulmonary shortness of breath, sleep apnea ,  breath sounds clear to auscultation  Pulmonary exam normal       Cardiovascular negative cardio ROS  + dysrhythmias Atrial Fibrillation + pacemaker Rhythm:Regular Rate:Normal     Neuro/Psych PSYCHIATRIC DISORDERS Depression negative neurological ROS     GI/Hepatic Neg liver ROS, GERD-  Medicated,  Endo/Other  negative endocrine ROS  Renal/GU Renal disease  negative genitourinary   Musculoskeletal negative musculoskeletal ROS (+)   Abdominal   Peds negative pediatric ROS (+)  Hematology  (+) anemia ,   Anesthesia Other Findings   Reproductive/Obstetrics negative OB ROS                           Anesthesia Physical Anesthesia Plan  ASA: III  Anesthesia Plan: General   Post-op Pain Management:    Induction: Intravenous  Airway Management Planned: LMA  Additional Equipment:   Intra-op Plan:   Post-operative Plan: Extubation in OR  Informed Consent: I have reviewed the patients History and Physical, chart, labs and discussed the procedure including the risks, benefits and alternatives for the proposed anesthesia with the patient or authorized representative who has indicated his/her understanding and acceptance.   Dental advisory given  Plan Discussed with: CRNA  Anesthesia Plan Comments:         Anesthesia Quick Evaluation

## 2014-02-02 NOTE — Transfer of Care (Signed)
Immediate Anesthesia Transfer of Care Note  Patient: Nichole Pena  Procedure(s) Performed: Procedure(s): CYSTOSCOPY WITH LEFT  RETROGRADE AND LEFT  STENT PLACEMENT (Left)  Patient Location: PACU  Anesthesia Type:General  Level of Consciousness: awake, alert , oriented and patient cooperative  Airway & Oxygen Therapy: Patient Spontanous Breathing and Patient connected to face mask oxygen  Post-op Assessment: Report given to PACU RN and Post -op Vital signs reviewed and stable  Post vital signs: stable  Complications: No apparent anesthesia complications

## 2014-02-02 NOTE — Sedation Documentation (Signed)
Pt , post surgery, experienced  Resp. Depression on CT table after initial medication. Pt. Previously alert and oriented. Dr. Laurence Ferrari at bediside. Attempted physical stimulation. Response was slight, no big breaths. Pt. Flipper supine onto stretcher, head elevated, simple mask applied. Pt. Responsive but sluggish. MD asked for 0.2 Narcan. Pt. Responded. mainitains good airway. O2 sats 100%. Pt. Repositioned on CT table. MD proceeded with BX.

## 2014-02-02 NOTE — Progress Notes (Signed)
Patient went to Interventional Radiology from PACU. Short Stay RN will chart on patient under Interventional Radiology Account .

## 2014-02-02 NOTE — H&P (View-Only) (Signed)
son For Visit Seen today for complaint of difficulty voiding and worsening (L) LE.   Active Problems Problems  1. Difficulty in urination (788.99) 2. Hydronephrosis, left (591)   Assessed By: Jimmey Ralph (Urology); Last Assessed: 19 Jan 2014 3. Lymphadenopathy (785.6)   Assessed By: Jimmey Ralph (Urology); Last Assessed: 19 Jan 2014 4. Ureteral obstruction (593.4)  History of Present Illness 80 YO female patient of Dr. Ralene Muskrat seen today for complaint of difficulty voiding and worsening (L) LE.     GU HX:     Last seen 01/12/14 for consultation by Dr. Laqueta Due for left hydronephrosis. She has a 2 month history of edema in the left leg and a CT was done to look for an intraabdominal cause and she was found to have changes along the left ureter that appeared inflammatory and there was obstruction of the left kidney that was not present on a prior scan in 2012. There was cortical thinning suggesting a long standing process and there were some enlarged node in the inguinal area. She has some fluid in the uterine cavity and has seen gynecology for attempted endometrial biopsy but this was unsuccessful due to severe cervical stenosis.  She has had hematuria. She began to have some pain in the left flank that was mild but has progressed over the last 2 weeks. She has a catch in the left groin. She has had no fever or night sweats.     Interval HX;   Today left leg is much more edematous and "tight". No weeping. States she has also noted a decrease in urine output but states that over past 36 hrs has not drank a lot of fluids and today has not taken Fursomeide due to "not being home."   Past Medical History Problems  1. History of Anxiety (300.00) 2. History of CPAP (continuous positive airway pressure) dependence (V46.8) 3. History of arthritis (V13.4) 4. History of atrial fibrillation (V12.59) 5. History of cardiac arrhythmia (V12.59) 6. History of cardiac murmur (V12.59) 7.  History of depression (V11.8) 8. History of esophageal reflux (V12.79) 9. History of heart failure (V12.59) 10. History of Obstructive sleep apnea, adult (327.23)  Surgical History Problems  1. History of Blepharoplasty Upper Lid W/ Excessive Skin 2. History of Cataract Surgery 3. History of Knee Replacement 4. History of Mitral Valve Replacement 5. History of Pacemaker Placement 6. History of Sinus Surgery 7. History of Tonsillectomy With Adenoidectomy  Current Meds 1. Anusol-HC 25 MG Rectal Suppository;  Therapy: (Recorded:02Jun2015) to Recorded 2. Coumadin TABS;  Therapy: (Recorded:02Jun2015) to Recorded 3. Hydrocodone-Acetaminophen TABS;  Therapy: (GSUPJSRP:59YVO5929) to Recorded 4. Iron TABS;  Therapy: (WKMQKMMN:81RRN1657) to Recorded 5. Lasix 40 MG Oral Tablet;  Therapy: (XUXYBFXO:32NVB1660) to Recorded 6. Lexapro 20 MG Oral Tablet;  Therapy: (AYOKHTXH:74FSE3953) to Recorded 7. Magnesium 500 MG Oral Tablet;  Therapy: (UYEBXIDH:68SHU8372) to Recorded 8. Mirapex 0.5 MG Oral Tablet;  Therapy: (BMSXJDBZ:20EYE2336) to Recorded 9. NexIUM 40 MG Oral Capsule Delayed Release;  Therapy: (PQAESLPN:30YFR1021) to Recorded 10. Pacerone 200 MG Oral Tablet;   Therapy: (RZNBVAPO:14DCV0131) to Recorded 11. Potassium Chloride 20 MEQ TBCR;   Therapy: (YHOOILNZ:97KQA0601) to Recorded 12. SEROquel 25 MG Oral Tablet;   Therapy: (VIFBPPHK:32XMD4709) to Recorded 13. Stool Softener TABS;   Therapy: (KHVFMBBU:03JQD6438) to Recorded 14. TraMADol HCl TABS;   Therapy: (VKFMMCRF:54HKG6770) to Recorded 15. Tylenol Arthritis Pain 650 MG Oral Tablet Extended Release;   Therapy: (HEKBTCYE:18HTM9311) to Recorded 16. Vitamin D3 CAPS;   Therapy: (Recorded:02Jun2015) to Recorded  Allergies Medication  1. Gabapentin CAPS  2. Wellbutrin TABS  Family History Problems  1. Family history of congestive heart failure (V17.49) : Mother 2. Family history of depression (V17.0) 3. Family history of  pancreatic cancer (V16.0) : Father  Social History Problems  1. Denied: History of Alcohol use 2. Caffeine use (V49.89) 3. Never a smoker 4. Number of children   3 daughters    2 sons 5. Retired 6. Widowed  Review of Systems Genitourinary, constitutional, skin, eye, otolaryngeal, hematologic/lymphatic, cardiovascular, pulmonary, endocrine, musculoskeletal, gastrointestinal, neurological and psychiatric system(s) were reviewed and pertinent findings if present are noted.  Genitourinary: oliguria.  Cardiovascular: leg swelling.    Vitals Vital Signs [Data Includes: Last 1 Day]  Recorded: 09Jun2015 01:30PM  Blood Pressure: 132 / 69 Temperature: 97.7 F Heart Rate: 81  Physical Exam Constitutional: Well nourished and well developed . No acute distress. The patient appears well hydrated.  LLE +3/4 left leg +2 right leg  Abdomen: The abdomen is obese. The abdomen is soft and nontender. No CVA tenderness.  Skin: Normal skin turgor and normal skin color and pigmentation.  Neuro/Psych:. Mood and affect are appropriate.    Results/Data Urine [Data Includes: Last 1 Day]   09Jun2015  COLOR YELLOW   APPEARANCE CLEAR   SPECIFIC GRAVITY 1.025   pH 5.5   GLUCOSE NEG mg/dL  BILIRUBIN NEG   KETONE TRACE mg/dL  BLOOD NEG   PROTEIN TRACE mg/dL  UROBILINOGEN 0.2 mg/dL  NITRITE NEG   LEUKOCYTE ESTERASE SMALL   SQUAMOUS EPITHELIAL/HPF MODERATE   WBC 0-2 WBC/hpf  RBC 0-2 RBC/hpf  BACTERIA MODERATE   CRYSTALS NONE SEEN   CASTS NONE SEEN   Other AMORPHOUS NOTED    The following clinical lab reports were reviewed:  UA- negative Stat BUN/creatine.  PVR: Ultrasound PVR 22 ml. Selected Results  BUN & CREATININE 09Jun2015 02:06PM Warden, Diane  SPECIMEN TYPE: BLOOD   Test Name Result Flag Reference  CREATININE 1.50 mg/dL H 0.50-1.40  BUN 23 mg/dL  6-23  Est GFR, African American 37 mL/min L   Est GFR, NonAfrican American 32 mL/min L   PERFORMED AT:        [facility]                       [address]                      [place], [place] [address]   THE ESTIMATED GFR IS A CALCULATION VALID FOR ADULTS (>=18 YEARS OLD) THAT USES THE CKD-EPI ALGORITHM TO ADJUST FOR AGE AND SEX. IT IS   NOT TO BE USED FOR CHILDREN, PREGNANT WOMEN, HOSPITALIZED PATIENTS,    PATIENTS ON DIALYSIS, OR WITH RAPIDLY CHANGING KIDNEY FUNCTION. ACCORDING TO THE NKDEP, EGFR >89 IS NORMAL, 60-89 SHOWS MILD IMPAIRMENT, 30-59 SHOWS MODERATE IMPAIRMENT, 15-29 SHOWS SEVERE IMPAIRMENT AND <15 IS ESRD.   Assessment Assessed  1. Lymphadenopathy (785.6) 2. Hydronephrosis, left (591) 3. Difficulty in urination (788.99) 4. Ureteral obstruction (593.4)  Plan  Difficulty in urination  1. PVR U/S; Status:Complete;   Done: 09Jun2015 Health Maintenance  2. UA With REFLEX; [Do Not Release]; Status:Complete;   Done: 09Jun2015 01:17PM Hydronephrosis, left, Lymphadenopathy  3. VENIPUNCTURE; Status:Complete;   Done: 09Jun2015  Stat BUN/creatine   Instructed to take diuretic daily and to keep leg elevated as much as possible  F/U next week with Dr.  to discuss proceeding with cysto/stent      BUN & CREATININE; Status:Resulted - Requires   Verification;  Done: 10UVO5366 12:00AM  Due:11Jun2015; Marked Important;Ordered; Stat;   YQI:HKVQQVZDGLOVFI, left, Lymphadenopathy; Ordered EP:PIRJJO, Diane;   Electronics engineer signed by : Jimmey Ralph, Trey Paula; Jan 19 2014  2:42PM EST

## 2014-02-02 NOTE — Procedures (Signed)
Interventional Radiology Procedure Note  Procedure: CT guided bx of LEFT perinephric / periureteral tissue Complications: Sedation induced respiratory depression requiring 0.4 mg Narcan.  Pt quickly recovered. Recommendations: - Bedrest x 2 hrs - O2 in place until fully recovered from sedation  Signed,  Criselda Peaches, MD [facility]

## 2014-02-02 NOTE — Discharge Instructions (Signed)
Ureteral Stent Implantation, Care After °Refer to this sheet in the next few weeks. These instructions provide you with information on caring for yourself after your procedure. Your health care provider may also give you more specific instructions. Your treatment has been planned according to current medical practices, but problems sometimes occur. Call your health care provider if you have any problems or questions after your procedure. °WHAT TO EXPECT AFTER THE PROCEDURE °You should be back to normal activity within 48 hours after the procedure. Nausea and vomiting may occur and are commonly the result of anesthesia. °It is common to experience sharp pain in the back or lower abdomen and penis with voiding. This is caused by movement of the ends of the stent with the act of urinating. It usually goes away within minutes after you have stopped urinating. °HOME CARE INSTRUCTIONS °Make sure to drink plenty of fluids. You may have small amounts of bleeding, causing your urine to be red. This is normal. Certain movements may trigger pain or a feeling that you need to urinate. You may be given medicines to prevent infection or bladder spasms. Be sure to take all medicines as directed. Only take over-the-counter or prescription medicines for pain, discomfort, or fever as directed by your health care provider. Do not take aspirin, as this can make bleeding worse. °Your stent will be left in until the blockage is resolved. This may take 2 weeks or longer, depending on the reason for stent implantation. You may have an X-ray exam to make sure your ureter is open and that the stent has not moved out of position (migrated). The stent can be removed by your health care provider in the office. Medicines may be given for comfort while the stent is being removed. Be sure to keep all follow-up appointments so your health care provider can check that you are healing properly. °SEEK MEDICAL CARE IF: °· You experience increasing  pain. °· Your pain medicine is not working. °SEEK IMMEDIATE MEDICAL CARE IF: °· Your urine is dark red or has blood clots. °· You are leaking urine (incontinent). °· You have a fever, chills, feeling sick to your stomach (nausea), or vomiting. °· Your pain is not relieved by pain medicine. °· The end of the stent comes out of the urethra. °· You are unable to urinate. °Document Released: 04/01/2013 Document Revised: 08/04/2013 Document Reviewed: 04/01/2013 °ExitCare® Patient Information ©2015 [employer]. This information is not intended to replace advice given to you by your health care provider. Make sure you discuss any questions you have with your health care provider. ° °

## 2014-02-02 NOTE — Progress Notes (Signed)
LLE is reddened and larger in diameter than RLE. Pt states that family MD and cardiologist in [place] is working up cause of edema in LLE. States leg has been like this for at least 3 months. No pain. " It just feels tight and tingles. "

## 2014-02-02 NOTE — Discharge Instructions (Signed)
Biopsy Care After Refer to this sheet in the next few weeks. These instructions provide you with information on caring for yourself after your procedure. Your caregiver may also give you more specific instructions. Your treatment has been planned according to current medical practices, but problems sometimes occur. Call your caregiver if you have any problems or questions after your procedure. If you had a fine needle biopsy, you may have soreness at the biopsy site for 1 to 2 days. If you had an open biopsy, you may have soreness at the biopsy site for 3 to 4 days. HOME CARE INSTRUCTIONS   You may resume normal diet and activities as directed.  Change bandages (dressings) as directed. If your wound was closed with a skin glue (adhesive), it will wear off and begin to peel in 7 days.  Only take over-the-counter or prescription medicines for pain, discomfort, or fever as directed by your caregiver.  Ask your caregiver when you can bathe and get your wound wet. SEEK IMMEDIATE MEDICAL CARE IF:   You have increased bleeding (more than a small spot) from the biopsy site.  You notice redness, swelling, or increasing pain at the biopsy site.  You have pus coming from the biopsy site.  You have a fever.  You notice a bad smell coming from the biopsy site or dressing.  You have a rash, have difficulty breathing, or have any allergic problems. MAKE SURE YOU:   Understand these instructions.  Will watch your condition.  Will get help right away if you are not doing well or get worse. Document Released: 02/16/2005 Document Revised: 10/22/2011 Document Reviewed: 01/25/2011 ExitCare Patient Information 2015 [employer]. This information is not intended to replace advice given to you by your health care provider. Make sure you discuss any questions you have with your health care provider.  

## 2014-02-02 NOTE — Sedation Documentation (Signed)
Pt. Remains stable. Good resp. Good oxygenation.

## 2014-02-02 NOTE — H&P (Signed)
Chief Complaint: "I am scheduled for a biopsy." Referring Physician: Dr. Jeffie Pollock HPI: Nichole Pena is an 80 y.o. female with a left RP mass obstructing the left ureter resulting in left hydronephrosis. The patient c/o LLE edema and difficulty urinating. She has been seen by Dr. Jeffie Pollock and is scheduled today in the OR for a left ureteral stent followed by a CT guided left RP mass biopsy. She denies any chest pain, shortness of breath at rest or palpitations. She denies any active signs of bleeding or excessive bruising. She denies any recent fever or chills. The patient admits to sleep apnea and does use a CPAP which she brought today. She denies any chronic oxygen use. She has previously tolerated sedation without complications.    Past Medical History:  Past Medical History  Diagnosis Date  . Anemia   . Atrial fibrillation   . Mitral valve prolapse   . DDD (degenerative disc disease)   . DJD (degenerative joint disease)   . Depression   . GERD (gastroesophageal reflux disease)   . Macular degeneration disease   . Cataract   . Hydronephrosis, left   . Pacemaker   . Sleep apnea   . Shortness of breath     shortness of breath- 01/26/14  states x years  . History of blood transfusion   . Leg edema, left 01/25/14    swollen about 3 x size of right leg- toe to hip/ moderate reddness anterior lower leg, non draining- states has been like this    Past Surgical History:  Past Surgical History  Procedure Laterality Date  . Pacemaker insertion  2008    Mitral heart replacement  . Knee arthroscopy  2008    right knee  . Cataract extraction w/ intraocular lens implant Bilateral   . Cardiac valve replacement  2011  . Tonsillectomy    . Joint replacement Right 2008    partial knee    Family History:  Family History  Problem Relation Age of Onset  . Pancreatic cancer Father   . Heart disease Mother   . Heart disease Sister     Social History:  reports that she has never smoked. She has  never used smokeless tobacco. She reports that she does not drink alcohol or use illicit drugs.  Allergies:  Allergies  Allergen Reactions  . Wellbutrin [Bupropion] Nausea Only    Medications:   Medication List    Notice   Cannot display patient medications because the patient has not yet been checked in.     Please HPI for pertinent positives, otherwise complete 10 system ROS negative.  Physical Exam: T: 97.75F, HR: 76 bpm, BP: 153/89 mmHg, O2: 91% RA  General Appearance:  Alert, cooperative, no distress  Head:  Normocephalic, without obvious abnormality, atraumatic  Neck: Supple, symmetrical, trachea midline  Lungs:   Clear to auscultation bilaterally, no w/r/r, respirations unlabored without use of accessory muscles.  Chest Wall:  No tenderness or deformity  Heart:  Regular rate and rhythm, heart valve click heard over mitral area, no rub or gallop.  Abdomen:   Soft, non-tender, non distended.  Extremities: Extremities LLE 3+ edema, erythematous no drainage, RLE 1+ edema without skin changes or drainage.   Neurologic: Normal affect, no gross deficits.   Recent Results (from the past 2160 hour(s))  BASIC METABOLIC PANEL     Status: Abnormal   Collection Time    01/26/14 11:26 AM      Result Value Ref Range   Sodium  142  137 - 147 mEq/L   Potassium 4.4  3.7 - 5.3 mEq/L   Chloride 104  96 - 112 mEq/L   CO2 28  19 - 32 mEq/L   Glucose, Bld 95  70 - 99 mg/dL   BUN 20  6 - 23 mg/dL   Creatinine, Ser 1.35 (*) 0.50 - 1.10 mg/dL   Calcium 9.3  8.4 - 10.5 mg/dL   GFR calc non Af Amer 36 (*) >90 mL/min   GFR calc Af Amer 41 (*) >90 mL/min   Comment: (NOTE)     The eGFR has been calculated using the CKD EPI equation.     This calculation has not been validated in all clinical situations.     eGFR's persistently <90 mL/min signify possible Chronic Kidney     Disease.  CBC     Status: None   Collection Time    01/26/14 11:26 AM      Result Value Ref Range   WBC 6.3  4.0 -  10.5 K/uL   RBC 4.27  3.87 - 5.11 MIL/uL   Hemoglobin 12.1  12.0 - 15.0 g/dL   HCT 38.4  36.0 - 46.0 %   MCV 89.9  78.0 - 100.0 fL   MCH 28.3  26.0 - 34.0 pg   MCHC 31.5  30.0 - 36.0 g/dL   RDW 14.7  11.5 - 15.5 %   Platelets 220  150 - 400 K/uL  PROTIME-INR     Status: Abnormal   Collection Time    01/26/14 11:26 AM      Result Value Ref Range   Prothrombin Time 37.3 (*) 11.6 - 15.2 seconds   INR 3.98 (*) 0.00 - 1.49  APTT     Status: Abnormal   Collection Time    01/26/14 11:26 AM      Result Value Ref Range   aPTT 66 (*) 24 - 37 seconds   Comment:            IF BASELINE aPTT IS ELEVATED,     SUGGEST PATIENT RISK ASSESSMENT     BE USED TO DETERMINE APPROPRIATE     ANTICOAGULANT THERAPY.  PROTIME-INR     Status: None   Collection Time    02/02/14  9:30 AM      Result Value Ref Range   Prothrombin Time 15.1  11.6 - 15.2 seconds   INR 1.22  0.00 - 1.49    No results found.  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 Patient has been NPO, coumadin held and transitioned to [employer] and images reviewed Risks and Benefits discussed with the patient. All of the patient's questions were answered, patient is agreeable to proceed. Consent signed and in chart.  Tsosie Billing D PA-C 02/02/2014, 9:56 AM

## 2014-02-02 NOTE — Brief Op Note (Signed)
02/02/2014  11:49 AM  PATIENT:  Nichole Pena  80 y.o. female  PRE-OPERATIVE DIAGNOSIS:  LEFT HYDRONEPHROSIS  POST-OPERATIVE DIAGNOSIS:  LEFT HYDRONEPHROSIS with left ureteral stricture  PROCEDURE:  Procedure(s): CYSTOSCOPY WITH LEFT  RETROGRADE AND LEFT  STENT PLACEMENT (Left)  SURGEON:  Surgeon(s) and Role:    * Malka So, MD - Primary  PHYSICIAN ASSISTANT:   ASSISTANTS: none   ANESTHESIA:   general  EBL:  Total I/O In: 350 [I.V.:350] Out: 0   BLOOD ADMINISTERED:none  DRAINS: 4.28fr x 24cm JJ stent on left   LOCAL MEDICATIONS USED:  NONE  SPECIMEN:  No Specimen  DISPOSITION OF SPECIMEN:  N/A  COUNTS:  YES  TOURNIQUET:  * No tourniquets in log *  DICTATION: .Other Dictation: Dictation Number [id number]  PLAN OF CARE: Discharge to home after PACU  PATIENT DISPOSITION:  PACU - hemodynamically stable.   Delay start of Pharmacological VTE agent (>24hrs) due to surgical blood loss or risk of bleeding: yes

## 2014-02-02 NOTE — Interval H&P Note (Signed)
History and Physical Interval Note:  She was cancelled last week because her INR was 3.9.   It is 1.22 today.   She is having more left flank pain and stable edema.   She is to have a biopsy of the retroperitoneal process after the stent change today.   02/02/2014 10:47 AM  Nichole Pena  has presented today for surgery, with the diagnosis of LEFT HYDRONEPHROSIS  The various methods of treatment have been discussed with the patient and family. After consideration of risks, benefits and other options for treatment, the patient has consented to  Procedure(s): CYSTOSCOPY WITH LEFT  RETROGRADE AND LEFT  STENT PLACEMENT (Left) as a surgical intervention .  The patient's history has been reviewed, patient examined, no change in status, stable for surgery.  I have reviewed the patient's chart and labs.  Questions were answered to the patient's satisfaction.     WRENN,Tramon Crescenzo J

## 2014-02-03 ENCOUNTER — Other Ambulatory Visit (HOSPITAL_COMMUNITY): Payer: Self-pay | Admitting: Urology

## 2014-02-03 ENCOUNTER — Encounter (HOSPITAL_COMMUNITY): Payer: Self-pay | Admitting: Urology

## 2014-02-03 NOTE — Anesthesia Postprocedure Evaluation (Signed)
  Anesthesia Post-op Note  Patient: Nichole Pena  Procedure(s) Performed: Procedure(s) (LRB): CYSTOSCOPY WITH LEFT  RETROGRADE AND LEFT  STENT PLACEMENT (Left)  Patient Location: PACU  Anesthesia Type: General  Level of Consciousness: awake and alert   Airway and Oxygen Therapy: Patient Spontanous Breathing  Post-op Pain: mild  Post-op Assessment: Post-op Vital signs reviewed, Patient's Cardiovascular Status Stable, Respiratory Function Stable, Patent Airway and No signs of Nausea or vomiting  Last Vitals:  Filed Vitals:   02/02/14 1315  BP: 156/60  Pulse: 59  Temp:   Resp: 18    Post-op Vital Signs: stable   Complications: No apparent anesthesia complications

## 2014-02-03 NOTE — Op Note (Signed)
NAMELATRAVIA, Nichole Pena                 ACCOUNT NO.:  [id number]  MEDICAL RECORD NO.:  [id number]  LOCATION:                                 FACILITY:  PHYSICIAN:  Marshall Cork. Jeffie Pollock, M.D.    DATE OF BIRTH:  [birth year]  DATE OF PROCEDURE:  02/02/2014 DATE OF DISCHARGE:  02/02/2014                              OPERATIVE REPORT   PROCEDURES:  Cystoscopy, left retrograde pyelogram with interpretation and insertion of left double-J stent.  PREOPERATIVE DIAGNOSIS:  Left ureteral obstruction with hydronephrosis.  POSTOPERATIVE DIAGNOSIS:  Left ureteral obstruction with hydronephrosis.  SURGEON:  Marshall Cork. Jeffie Pollock, M.D.  ANESTHESIA:  General.  SPECIMEN:  None.  DRAINS:  4.8-French x 24 cm left double-J stent.  BLOOD LOSS:  None.  COMPLICATIONS:  None.  INDICATIONS:  Ms. Vincelette is an 80 year old white female, who was found to have left hydronephrosis on evaluation of a edema on her left leg.  She has a retroperitoneal process of uncertain etiology that is causing extrinsic obstruction of the ureter from the iliacs to the renal pelvis. It was felt that stenting was indicated to relieve the obstruction.  She is also to undergo retroperitoneal biopsy today for evaluation of the underlying process.  FINDINGS OF PROCEDURE:  She was taken to the operating room where general anesthetic was induced.  She was given Cipro.  She was placed in the lithotomy position and prepped with Betadine solution.  She was draped in usual sterile fashion.  Cystoscopy was performed using a 22-French scope and 12-degree lens. Examination revealed a normal urethra.  The bladder wall was smooth and pale without tumor, stones, or inflammation.  There was a diverticulum on the left side inferior lateral to the ureteral orifice.  The orifices were unremarkable.  The left ureteral orifice was cannulated with 5-French open-end catheter and contrast was instilled.  This revealed narrowing of the ureter from just below the  iliacs to the renal pelvis without obvious filling defects.  There was marked dilation of the intrarenal collecting system on the left.  A guidewire was passed easily to the kidney through the open-end catheter and the open-end catheter was advanced to kidney without difficulty.  At this point, a 6-French 24 cm double-J stent was advanced over the wire, although it was very very tight and I was not confident, had to adequately position proximally.  The 6-French was then removed and a 4.8- [place] was then passed to the kidney without difficulty.  The wire was removed leaving a good coil in the kidney and a good coil in the bladder.  The bladder was drained.  The patient was taken down from lithotomy position.  Her anesthetic was reversed.  She was moved to recovery in stable condition.  She will go for retroperitoneal biopsy later today.     Marshall Cork. Jeffie Pollock, M.D.     JJW/MEDQ  D:  02/02/2014  T:  02/02/2014  Job:  [id number]

## 2014-02-04 ENCOUNTER — Other Ambulatory Visit (HOSPITAL_COMMUNITY): Payer: Self-pay | Admitting: Urology

## 2014-02-04 DIAGNOSIS — N135 Crossing vessel and stricture of ureter without hydronephrosis: Secondary | ICD-10-CM

## 2014-02-04 DIAGNOSIS — R591 Generalized enlarged lymph nodes: Secondary | ICD-10-CM

## 2014-02-24 ENCOUNTER — Other Ambulatory Visit (HOSPITAL_COMMUNITY)
Admission: RE | Admit: 2014-02-24 | Discharge: 2014-02-24 | Disposition: A | Discharge status: Home / Self Care | Payer: Medicare Other | Source: Ambulatory Visit | Attending: Urology | Admitting: Urology

## 2014-02-24 DIAGNOSIS — C801 Malignant (primary) neoplasm, unspecified: Secondary | ICD-10-CM | POA: Diagnosis present

## 2014-03-05 ENCOUNTER — Other Ambulatory Visit (HOSPITAL_COMMUNITY)
Admission: RE | Admit: 2014-03-05 | Discharge: 2014-03-05 | Disposition: A | Discharge status: Home / Self Care | Payer: Medicare Other | Source: Ambulatory Visit | Attending: Gynecologic Oncology | Admitting: Gynecologic Oncology

## 2014-03-05 ENCOUNTER — Ambulatory Visit: Payer: Medicare Other | Attending: Gynecologic Oncology | Admitting: Gynecologic Oncology

## 2014-03-05 ENCOUNTER — Encounter: Payer: Self-pay | Admitting: Gynecologic Oncology

## 2014-03-05 ENCOUNTER — Telehealth: Payer: Self-pay | Admitting: Gastroenterology

## 2014-03-05 VITALS — BP 163/84 | HR 71 | Temp 97.6°F | Resp 18 | Ht 62.0 in | Wt 202.3 lb

## 2014-03-05 DIAGNOSIS — I4891 Unspecified atrial fibrillation: Secondary | ICD-10-CM | POA: Insufficient documentation

## 2014-03-05 DIAGNOSIS — C669 Malignant neoplasm of unspecified ureter: Secondary | ICD-10-CM | POA: Insufficient documentation

## 2014-03-05 DIAGNOSIS — Z124 Encounter for screening for malignant neoplasm of cervix: Secondary | ICD-10-CM | POA: Insufficient documentation

## 2014-03-05 DIAGNOSIS — Z950 Presence of cardiac pacemaker: Secondary | ICD-10-CM | POA: Diagnosis not present

## 2014-03-05 DIAGNOSIS — C801 Malignant (primary) neoplasm, unspecified: Secondary | ICD-10-CM | POA: Diagnosis present

## 2014-03-05 DIAGNOSIS — K219 Gastro-esophageal reflux disease without esophagitis: Secondary | ICD-10-CM | POA: Diagnosis not present

## 2014-03-05 DIAGNOSIS — I059 Rheumatic mitral valve disease, unspecified: Secondary | ICD-10-CM | POA: Diagnosis not present

## 2014-03-05 DIAGNOSIS — R1900 Intra-abdominal and pelvic swelling, mass and lump, unspecified site: Secondary | ICD-10-CM

## 2014-03-05 DIAGNOSIS — Z954 Presence of other heart-valve replacement: Secondary | ICD-10-CM | POA: Diagnosis not present

## 2014-03-05 DIAGNOSIS — Z7901 Long term (current) use of anticoagulants: Secondary | ICD-10-CM

## 2014-03-05 DIAGNOSIS — Z952 Presence of prosthetic heart valve: Secondary | ICD-10-CM

## 2014-03-05 DIAGNOSIS — N135 Crossing vessel and stricture of ureter without hydronephrosis: Secondary | ICD-10-CM

## 2014-03-05 DIAGNOSIS — G4730 Sleep apnea, unspecified: Secondary | ICD-10-CM | POA: Diagnosis not present

## 2014-03-05 MED ORDER — ENOXAPARIN SODIUM 100 MG/ML ~~LOC~~ SOLN: 100.0000 mg | Freq: Two times a day (BID) | SUBCUTANEOUS | Status: DC

## 2014-03-05 NOTE — Telephone Encounter (Signed)
Reviewed Dr. Buel Ream dictation and he felt patient was not a candidate for procedure in 2013. Spoke with Stanton Kidney at Dr. Serita Grit office and she will have MD review this note and let us know if patient needs an OV to discuss possible procedure.

## 2014-03-05 NOTE — Progress Notes (Signed)
Consult Note: Gyn-Onc  Consult was requested by Dr. Alinda Money for the evaluation of Nichole Pena 80 y.o. female with adenocarcinoma involving the left ureter, of unclear primary origin.  CC:  Chief Complaint  Patient presents with  . Mass    Assessment/Plan:  Ms. Nichole Pena  is a 80 y.o.  year old woman who is seen in consultation by Dr Alinda Money for adenocarcinoma involving and obstructing the left ureter of unclear primary.   [name] presents with a complex picture. My examination findings today are consistent with a malignant process involving the posterior uterus, cervix, and rectosigmoid colon. Because the cervix appears grossly normal and there is no uterine bleeding is unclear that this process primarily originated from GYN organs. However, her original CT of the pelvis in May 2015 did reveal and abnormally enlarged uterine cavity filled with fluid, the special stains from her periureteral biopsy stained positive for CK 7 and not CK 20 (which can be a marker for her primary GYN malignancy), and the cell type is unusual for a primary urothelial malignancy.   I have requested a CT and PET imaging be sent to my office so that I can personally view the pelvic anatomy and pathology, because her exam is somewhat limited due to body habitus. I feel that she would benefit from an exam under anesthesia, D&C and endocervical biopsy, however because of her unstable anticoagulation status (her current INR is 4.7) we will need to schedule this is an outpatient operative procedure on 03/19/14. I have discussed this with Dr. Alinda Money and he is interested in performing cystoscopy and ureteroscopy simultaneously. In the interim we have scheduled her for a transvaginal US to better characterize the pelvic anatomy and discern if she has a pelvic mass or gyn pathology. I have prescribed Lovenox for the patient to take (100 mg twice a day) so that bleeding becomes less of a risk during her anticipated upcoming procedures  and biopsies due to our ability to hold this medication acutely and restart quickly postprocedure.  I am concerned about the posterior mass, and a history of rectal bleeding in the past 6 months, being evidence of a primary colonic process. Due to her artificial heart valve she is felt to be a poor candidate for outpatient colonoscopy, however will see [place] [facility] physicians for a consultation regarding this. If a GYN origin of tumo is not identified on intraoperative exam under anesthesia D&C and cervical biopsy, or molecular testing of her tumor pathology, then rectal cancer should be excluded.  Regardless of whether this is a primary GYN or colorectal tumor, surgery would not be curative in this patient given that she has an extensive pelvic and upper renal tract disease involvement. Instead it is likely that she will require primary chemoradiation to control the locally extensive process. We will make plans to ensure she is seen by radiation oncology for treatment planning.   HPI: Ms. Nichole Pena is an 80 year old woman who is seen in consultation at the request of Dr. Alinda Money of [facility] for her a left peri-ureteral adenocarcinoma of unclear origin. Her symptoms began approximately 3 months ago predominantly with left lower extremity edema. She reports no postmenopausal bleeding. She reports that she has had intermittent rectal bleeding (heavier than what she would expect from hemorrhoids) in the past 12 months (last episode was December 2014). She has never had an abnormal pap smear. She denies urinary or GI obstructive symptoms.   For her symptoms she underwent workup at [facility] in  [place] on 12/30/13 with lower extremity Dopplers, and CT imaging of the abdomen and pelvis. He this imaging showed severe left hydronephrosis, cortical thinning and inflammation/thickening along the entire length of the left ureter. There was retraction of the left ovary and left common iliac vein  by this process. Of note no discrete pelvic mass is commented on in the CT scan. The uterine cavity is described as being dilated with fluid. There were lymph nodes in the left inguinal region measuring 1.5 cm in greatest dimension.  A CT-guided percutaneous core needle biopsy of the left periureteral tissue was performed on 02/02/2014 this revealed adenocarcinoma strongly positive for CK 20 and P. 53 negative for CK 7. It was ER PR receptor negative. This pathology has been sent for her additional molecular testing to help elucidate primary tumor site.   She was also taken to the OR by Dr Jeffie Pollock (urology) in [place] on 02/02/14 for placement of a 4.8French ureteral stent.  A PET scan was performed on 02/17/2014 at [facility] and revealed an amorphous abnormal soft tissue attenuation tracking along the left ureter that was hypermetabolic, with no clear distant primary lesion if this was a metastatic lesion. The dilated fluid filled endometrial cavity was again observed but with no associated hypermetabolism within the uterus was cervix to suggest associated malignancy. There was a small hypermetabolic focus in the left T6 transverse process. The mild inguinal lymphadenopathy that had been seen on prior CT scan was not hypermetabolic on PET.  She was seen by Dr. Alinda Money on 02/23/2014 who felt that her presentation was quite unusual for primary urothelial carcinoma given that she has no hematuria and she has left inguinal lymphadenopathy and left lower stream he edema which are not commonly seen with urothelial cancer.  Of note, the patient has a history of atrial fibrillation and an artificial mitral valve replacement for which she requires constant therapeutic anticoagulation.  Interval History: She continues to feel generally well without pain, bleeding or progressive LE edema. Her INR at last check (yesterday) was elevated at 4.7.  Current Meds:  Outpatient Encounter Prescriptions as of  03/05/2014  Medication Sig  . acetaminophen (TYLENOL) 500 MG tablet Take 1,000 mg by mouth every 6 (six) hours as needed (Pain).  Marland Kitchen amiodarone (PACERONE) 200 MG tablet Take 200 mg by mouth every morning.   Marland Kitchen aspirin 81 MG chewable tablet Chew 81 mg by mouth.  . beta carotene w/minerals (OCUVITE) tablet Take 1 tablet by mouth daily.  . calcium citrate (CALCITRATE - DOSED IN MG ELEMENTAL CALCIUM) 950 MG tablet Take 1 tablet by mouth.  . Cholecalciferol (VITAMIN D) 2000 UNITS CAPS Take 2,000 Units by mouth daily.  Marland Kitchen docusate sodium (COLACE) 100 MG capsule Take 100 mg by mouth 2 (two) times daily.  . DULoxetine (CYMBALTA) 60 MG capsule Take 60 mg by mouth.  . enoxaparin (LOVENOX) 100 MG/ML injection Inject 1 mL (100 mg total) into the skin every 12 (twelve) hours.  Marland Kitchen escitalopram (LEXAPRO) 20 MG tablet Take 20 mg by mouth every morning.  Marland Kitchen esomeprazole (NEXIUM) 40 MG capsule Take 40 mg by mouth daily with breakfast.   . ferrous fumarate (FERRO-SEQUELS) 50 MG CR tablet Take 45 mg by mouth.  . ferrous sulfate 325 (65 FE) MG tablet Take 325 mg by mouth 2 (two) times daily.  . furosemide (LASIX) 40 MG tablet Take 40 mg by mouth daily.  Marland Kitchen HYDROcodone-acetaminophen (NORCO/VICODIN) 5-325 MG per tablet Take 1 tablet by mouth every  6 (six) hours as needed for moderate pain.  Marland Kitchen lisinopril (PRINIVIL,ZESTRIL) 10 MG tablet Take 10 mg by mouth.  Marland Kitchen LORazepam (ATIVAN) 0.5 MG tablet Take 0.5 mg by mouth every 8 (eight) hours as needed for anxiety.  . Magnesium 250 MG TABS Take 250 mg by mouth 2 (two) times daily.  . methocarbamol (ROBAXIN) 500 MG tablet Take 500 mg by mouth.  Marland Kitchen omeprazole (PRILOSEC) 20 MG capsule Take 20 mg by mouth.  . polyethylene glycol (MIRALAX / GLYCOLAX) packet Take 17 g by mouth at bedtime.  . polyvinyl alcohol (LIQUIFILM TEARS) 1.4 % ophthalmic solution Place 1 drop into both eyes as needed for dry eyes.  . potassium chloride (K-DUR) 10 MEQ tablet Take 20 mg by mouth.  . potassium  chloride SA (K-DUR,KLOR-CON) 20 MEQ tablet Take 20 mEq by mouth daily.  . pramipexole (MIRAPEX) 0.5 MG tablet Take 0.5 mg by mouth 2 (two) times daily.  . predniSONE (DELTASONE) 20 MG tablet Take 40 mg by mouth.  . QUEtiapine (SEROQUEL) 25 MG tablet Take 25 mg by mouth at bedtime.  . traMADol (ULTRAM) 50 MG tablet Take 50 mg by mouth every 6 (six) hours as needed (Pain).  Marland Kitchen warfarin (COUMADIN) 5 MG tablet Take 5 mg by mouth daily.  Marland Kitchen zolpidem (AMBIEN CR) 12.5 MG CR tablet Take 5 mg by mouth.  . [DISCONTINUED] enoxaparin (LOVENOX) 100 MG/ML injection Inject 100 mg into the skin.    Allergy:  Allergies  Allergen Reactions  . Wellbutrin [Bupropion] Nausea Only    Social Hx:   History   Social History  . Marital Status: Widowed    Spouse Name: N/A    Number of Children: 49  . Years of Education: N/A   Occupational History  . Retired    Social History Main Topics  . Smoking status: Never Smoker   . Smokeless tobacco: Never Used  . Alcohol Use: No  . Drug Use: No  . Sexual Activity: Not on file   Other Topics Concern  . Not on file   Social History Narrative  . No narrative on file    Past Surgical Hx:  Past Surgical History  Procedure Laterality Date  . Pacemaker insertion  2008    Mitral heart replacement  . Knee arthroscopy  2008    right knee  . Cataract extraction w/ intraocular lens implant Bilateral   . Cardiac valve replacement  2011  . Tonsillectomy    . Joint replacement Right 2008    partial knee  . Cystoscopy with retrograde pyelogram, ureteroscopy and stent placement Left 02/02/2014    Procedure: CYSTOSCOPY WITH LEFT  RETROGRADE AND LEFT  STENT PLACEMENT;  Surgeon: Malka So, MD;  Location: [facility];  Service: Urology;  Laterality: Left;    Past Medical Hx:  Past Medical History  Diagnosis Date  . Anemia   . Atrial fibrillation   . Mitral valve prolapse   . DDD (degenerative disc disease)   . DJD (degenerative joint disease)   . Depression   .  GERD (gastroesophageal reflux disease)   . Macular degeneration disease   . Cataract   . Hydronephrosis, left   . Pacemaker   . Sleep apnea   . Shortness of breath     shortness of breath- 01/26/14  states x years  . History of blood transfusion   . Leg edema, left 01/25/14    swollen about 3 x size of right leg- toe to hip/ moderate reddness anterior lower  leg, non draining- states has been like this    Past Gynecological History:  Menopause in her 54's, 5 SVD's. No prior abnormal pap smears.  No LMP recorded. Patient is postmenopausal.  Family Hx:  Family History  Problem Relation Age of Onset  . Pancreatic cancer Father   . Heart disease Mother   . Heart disease Sister     Review of Systems:  Constitutional  Feels well,  No acute complaints  ENT Normal appearing ears and nares bilaterally Skin/Breast  No rash, sores, jaundice, itching, dryness Cardiovascular  No chest pain, shortness of breath, or edema  Pulmonary  No cough or wheeze.  Gastro Intestinal  No nausea, vomitting, or diarrhoea. +bright red blood per rectum. no abdominal pain, change in bowel movement, or constipation.  Genito Urinary  No frequency, urgency, dysuria, see HPI Musculo Skeletal  No myalgia, arthralgia, or pain. + LLE edema Neurologic  No weakness, numbness, change in gait,  Psychology  No depression, anxiety, insomnia.   Vitals:  Blood pressure 163/84, pulse 71, temperature 97.6 F (36.4 C), temperature source Oral, resp. rate 18, height 5\' 2"  (1.575 m), weight 202 lb 4.8 oz (91.763 kg).  Physical Exam: WD in NAD Neck  Supple NROM, without any enlargements.  Lymph Node Survey No cervical supraclavicular or inguinal adenopathy Cardiovascular  Pulse normal rate, regularity and rhythm. Artficial valve clicks noted on ausculation. Lungs  Clear to auscultation bilateraly, without wheezes/crackles/rhonchi. Good air movement.  Skin  No rash/lesions/breakdown  Psychiatry  Alert and  oriented to person, place, and time  Abdomen  Normoactive bowel sounds, abdomen soft, non-tender and obese without evidence of hernia. No palpable fluid shift or masses. Back No CVA tenderness Genito Urinary  Vulva/vagina: Normal external female genitalia.  No lesions. No discharge or bleeding.  Bladder/urethra:  No lesions or masses, well supported bladder  Vagina: grossly normal, no lesions visible, no blood.  Cervix: Small, pulled/tethered anteriorally. No lesions. Palpably firm, but no lesions or discrete nodularity.  Uterus: Small, minimally mobile/somehwhat fixed. Posterior nodularity/confluence.  Adnexa: no adnexal masses. Rectal  Good tone, + culde sac nodularity/illdefined mass contiguous with uterus. Extremities  No bilateral cyanosis, clubbing or edema.   [name], MD   03/05/2014, 5:38 PM

## 2014-03-05 NOTE — Patient Instructions (Addendum)
You will receive a call with appointment for a colonoscopy at [facility]. You will receive a call with an appointment for Dr. Sondra Come in [facility]. We will notify you of your pap smear results.   Enoxaparin, Home Use Enoxaparin (Lovenox) injection is a medication used to prevent clots from developing in your veins. Medications such as enoxaparin are called blood thinners or anticoagulants. If blood clots are untreated they could travel to your lungs. This is called a pulmonary embolus. A blood clot in your lungs can be fatal. Caregivers often use anticoagulants such as enoxaparin to prevent clots following surgery. It is also used along with aspirin when the heart is not getting enough blood. Continue the enoxaparin injections as directed by your caregiver. Your caregiver will use blood clotting test results to decide when you can safely stop using enoxaparin injections. If your caregiver prescribes any additional anticoagulant, you must take it exactly as directed. RISKS AND COMPLICATIONS  If you have received recent epidural anesthesia, spinal anesthesia, or a spinal tap while receiving anticoagulants, you are at risk for developing a blood clot in or around the spine. This condition could result in long-term or permanent paralysis.  Because anticoagulants thin your blood, severe bleeding may occur from any tissue or organ. Symptoms of the blood being too thin may include:  Bleeding from the nose or gums that does not stop quickly.  Unusual bruising or bruising easily.  Swelling or pain at an injection site.  A cut that does not stop bleeding within 10 minutes.  Continual nausea for more than 1 day or vomiting blood.  Coughing up blood.  Blood in the urine which may appear as pink, red, or brown urine.  Blood in bowel movements which may appear as red, dark or black stools.  Sudden weakness or numbness of the face, arm, or leg, especially on one side of the body.  Sudden  confusion.  Trouble speaking (aphasia) or understanding.  Sudden trouble seeing in one or both eyes.  Sudden trouble walking.  Dizziness.  Loss of balance or coordination.  Severe pain, such as a headache, joint pain, or back pain.  Fever.  Bruising around the injection sites may be expected.  Platelet drops, known as "thrombocytopenia," can occur with enoxaparin use. A condition called "heparin-induced thrombocytopenia" has been seen. If you have had this condition, you should tell your caregiver. Your caregiver may direct you to have blood tests to monitor this condition.  Do not use if you have allergies to the medication, heparin, or pork products.  Other side effects may include mild local reactions or irritation at the site of injection, pain, bruising, and redness of skin. HOME CARE INSTRUCTIONS You will be instructed by your caregiver how to give enoxaparin injections. 1. Before giving your medication you should make sure the injection is a clear and colorless or pale yellow solution. If your medication becomes discolored or has particles in the bottle, do not use and notify your caregiver. 2. When using the 30 and 40 mg pre-filled syringes, do not expel the air bubble from the syringe before the injection. This makes sure you use all the medication in the syringe. 3. The injections will be given subcutaneously. This means it is given into the fat over the belly (abdomen). It is given deep beneath the skin but not into the muscle. The shots should be injected around the abdominal wall. Change the sites of injection each time. The whole length of the needle should be  introduced into a skin fold held between the thumb and forefinger; the skin fold should be held throughout the injection. Do not rub the injection site after completion of the injection. This increases bruising. Enoxaparin injection pre-filled syringes and graduated pre-filled syringes are available with a system that  shields the needle after injection. 4. Inject by pushing the plunger to the bottom of the syringe. 5. Remove the syringe from the injection site keeping your finger on the plunger rod. Be careful not to stick yourself or others. 6. After injection and the syringe is empty, set off the safety system by firmly pushing the plunger rod. The protective sleeve will automatically cover the needle and you can hear a click. The click means your needle is safely covered. Do not try replacing the needle shield. 7. Get rid of the syringe in the nearest sharps container. 8. Keep your medication safely stored at room temperatures.  Due to the complications of anticoagulants, it is very important that you take your anticoagulant as directed by your caregiver. Anticoagulants need to be taken exactly as instructed. Be sure you understand all your anticoagulant instructions.  Changes in medicines, supplements, diet, and illness can affect your anticoagulation therapy. Be sure to inform your caregivers of any of these changes.  While on anticoagulants, you will need to have blood tests done routinely as directed by your caregivers.  Be careful not to cut yourself when using sharp objects.  Limit physical activities or sports that could result in a fall or cause injury.  It is extremely important that you tell all of your caregivers and dentist that you are taking an anticoagulant, especially if you are injured or plan to have any type of procedure or operation.  Follow up with your laboratory test and caregiver appointments as directed. It is very important to keep your appointments. Not keeping appointments could result in a chronic or permanent injury, pain, or disability. SEEK MEDICAL CARE IF:  You develop any rashes.  You have any worsening of the condition for which you are receiving anticoagulation therapy. SEEK IMMEDIATE MEDICAL CARE IF:  Bleeding from the nose or gums does not stop quickly.  You  have unusual bruising or are bruising easily.  Swelling or pain occurs at an injection site.  A cut does not stop bleeding within 10 minutes.  You have continual nausea for more than 1 day or are vomiting blood.  You are coughing up blood.  You have blood in the urine.  You have dark or black stools.  You have sudden weakness or numbness of the face, arm, or leg, especially on one side of the body.  You have sudden confusion.  You have trouble speaking (aphasia) or understanding.  You have sudden trouble seeing in one or both eyes.  You have sudden trouble walking.  You have dizziness.  You have a loss of balance or coordination.  You have severe pain, such as a headache, joint pain, or back pain.  You have a serious fall or head injury, even if you are not bleeding.  You have an oral temperature above 102 F (38.9 C), not controlled by medicine. ANY OF THESE SYMPTOMS MAY REPRESENT A SERIOUS PROBLEM THAT IS AN EMERGENCY. Do not wait to see if the symptoms will go away. Get medical help right away. Call your local emergency services (911 in [place] DO NOT drive yourself to the hospital. MAKE SURE YOU:  Understand these instructions.  Will watch your condition.  Will get  help right away if you are not doing well or get worse. Document Released: 05/31/2004 Document Revised: 10/22/2011 Document Reviewed: 07/30/2005 [facility] Patient Information 2015 [place], [place]. This information is not intended to replace advice given to you by your health care provider. Make sure you discuss any questions you have with your health care provider.   Colonoscopy A colonoscopy is an exam to look at the entire large intestine (colon). This exam can help find problems such as tumors, polyps, inflammation, and areas of bleeding. The exam takes about 1 hour.  LET [facility] CARE PROVIDER KNOW ABOUT:   Any allergies you have.  All medicines you are taking, including vitamins, herbs, eye  drops, creams, and over-the-counter medicines.  Previous problems you or members of your family have had with the use of anesthetics.  Any blood disorders you have.  Previous surgeries you have had.  Medical conditions you have. RISKS AND COMPLICATIONS  Generally, this is a safe procedure. However, as with any procedure, complications can occur. Possible complications include:  Bleeding.  Tearing or rupture of the colon wall.  Reaction to medicines given during the exam.  Infection (rare). BEFORE THE PROCEDURE   Ask your health care provider about changing or stopping your regular medicines.  You may be prescribed an oral bowel prep. This involves drinking a large amount of medicated liquid, starting the day before your procedure. The liquid will cause you to have multiple loose stools until your stool is almost clear or light green. This cleans out your colon in preparation for the procedure.  Do not eat or drink anything else once you have started the bowel prep, unless your health care provider tells you it is safe to do so.  Arrange for someone to drive you home after the procedure. PROCEDURE   You will be given medicine to help you relax (sedative).  You will lie on your side with your knees bent.  A long, flexible tube with a light and camera on the end (colonoscope) will be inserted through the rectum and into the colon. The camera sends video back to a computer screen as it moves through the colon. The colonoscope also releases carbon dioxide gas to inflate the colon. This helps your health care provider see the area better.  During the exam, your health care provider may take a small tissue sample (biopsy) to be examined under a microscope if any abnormalities are found.  The exam is finished when the entire colon has been viewed. AFTER THE PROCEDURE   Do not drive for 24 hours after the exam.  You may have a small amount of blood in your stool.  You may pass  moderate amounts of gas and have mild abdominal cramping or bloating. This is caused by the gas used to inflate your colon during the exam.  Ask when your test results will be ready and how you will get your results. Make sure you get your test results. Document Released: 07/27/2000 Document Revised: 05/20/2013 Document Reviewed: 04/06/2013 [facility] Patient Information 2015 [place], [place]. This information is not intended to replace advice given to you by your health care provider. Make sure you discuss any questions you have with your health care provider.

## 2014-03-08 NOTE — Telephone Encounter (Signed)
Spoke with patient's daughter and told her I am waiting to hear from Dr. Serita Grit office.

## 2014-03-09 ENCOUNTER — Encounter: Payer: Self-pay | Admitting: Gastroenterology

## 2014-03-09 ENCOUNTER — Telehealth: Payer: Self-pay | Admitting: *Deleted

## 2014-03-09 ENCOUNTER — Ambulatory Visit (INDEPENDENT_AMBULATORY_CARE_PROVIDER_SITE_OTHER): Payer: Medicare Other | Admitting: Gastroenterology

## 2014-03-09 ENCOUNTER — Ambulatory Visit (HOSPITAL_COMMUNITY)
Admission: RE | Admit: 2014-03-09 | Discharge: 2014-03-09 | Disposition: A | Discharge status: Home / Self Care | Payer: Medicare Other | Source: Ambulatory Visit | Attending: Gynecologic Oncology | Admitting: Gynecologic Oncology

## 2014-03-09 VITALS — BP 150/76 | HR 68 | Ht 61.25 in | Wt 201.4 lb

## 2014-03-09 DIAGNOSIS — I509 Heart failure, unspecified: Secondary | ICD-10-CM

## 2014-03-09 DIAGNOSIS — I5022 Chronic systolic (congestive) heart failure: Secondary | ICD-10-CM | POA: Insufficient documentation

## 2014-03-09 DIAGNOSIS — N859 Noninflammatory disorder of uterus, unspecified: Secondary | ICD-10-CM | POA: Insufficient documentation

## 2014-03-09 DIAGNOSIS — K625 Hemorrhage of anus and rectum: Secondary | ICD-10-CM

## 2014-03-09 DIAGNOSIS — R1900 Intra-abdominal and pelvic swelling, mass and lump, unspecified site: Secondary | ICD-10-CM | POA: Diagnosis present

## 2014-03-09 LAB — CYTOLOGY - PAP

## 2014-03-09 NOTE — Progress Notes (Signed)
_                                                                                                                History of Present Illness:  Ms. Nichole Pena is a pleasant 80 year old white female referred for evaluation of abdominal pain.  For several months she has had vague lower abdominal pain.  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  A stent was placed.  Percutaneous biopsy demonstrated an adenocarcinoma.  PET scan apparently was negative.  Scans are not available for review.  The patient has had intermittent rectal bleeding for about 2 years.  It may occur once a month.  When it occurs she sees blood in the toilet, mixed with his stools and sometimes stains her underclothes.  Because of her cardiac disease colonoscopy was never done.  She complains of chronic constipation.  Patient is on Lovenox because of a heart valve replacement and a history of atrial fibrillation.   Past Medical History  Diagnosis Date  . Anemia 2014  . Atrial fibrillation 2004  . Mitral valve prolapse   . DDD (degenerative disc disease)   . DJD (degenerative joint disease)   . Depression   . GERD (gastroesophageal reflux disease)   . Macular degeneration disease   . Cataract   . Hydronephrosis, left   . Pacemaker   . Sleep apnea   . Shortness of breath     shortness of breath- 01/26/14  states x years  . History of blood transfusion   . Leg edema, left 01/25/14    swollen about 3 x size of right leg- toe to hip/ moderate reddness anterior lower leg, non draining- states has been like this  . Arthritis   . CHF (congestive heart failure)   . Diverticulosis    Past Surgical History  Procedure Laterality Date  . Pacemaker insertion  2008    Mitral heart replacement  . Knee arthroscopy Right 2008  . Cataract extraction w/ intraocular lens implant Bilateral   . Cardiac valve replacement   2011    mechanical  . Tonsillectomy    . Joint replacement Right 2008    partial knee  . Cystoscopy with retrograde pyelogram, ureteroscopy and stent placement Left 02/02/2014    Procedure: CYSTOSCOPY WITH LEFT  RETROGRADE AND LEFT  STENT PLACEMENT;  Surgeon: Malka So, MD;  Location: [facility];  Service: Urology;  Laterality: Left;   family history includes Heart disease in her mother and sister; Liver cancer in her father; Lung cancer in her father. Current Outpatient Prescriptions  Medication Sig Dispense Refill  . acetaminophen (TYLENOL) 500 MG tablet Take 1,000 mg by mouth every 6 (six) hours as needed (Pain).      Marland Kitchen amiodarone (PACERONE) 200 MG tablet Take 200 mg by mouth every morning.       . beta carotene w/minerals (OCUVITE) tablet Take 1 tablet by mouth daily.      Marland Kitchen  Cholecalciferol (VITAMIN D) 2000 UNITS CAPS Take 2,000 Units by mouth daily.      Marland Kitchen docusate sodium (COLACE) 100 MG capsule Take 100 mg by mouth 2 (two) times daily.      . DULoxetine (CYMBALTA) 60 MG capsule Take 60 mg by mouth.      . enoxaparin (LOVENOX) 100 MG/ML injection Inject 1 mL (100 mg total) into the skin every 12 (twelve) hours.  28 Syringe  1  . escitalopram (LEXAPRO) 20 MG tablet Take 20 mg by mouth every morning.      Marland Kitchen esomeprazole (NEXIUM) 40 MG capsule Take 40 mg by mouth daily with breakfast.       . ferrous sulfate 325 (65 FE) MG tablet Take 325 mg by mouth 2 (two) times daily.      . furosemide (LASIX) 40 MG tablet Take 40 mg by mouth daily.      Marland Kitchen HYDROcodone-acetaminophen (NORCO/VICODIN) 5-325 MG per tablet Take 1 tablet by mouth every 6 (six) hours as needed for moderate pain.      Marland Kitchen LORazepam (ATIVAN) 0.5 MG tablet Take 0.5 mg by mouth every 8 (eight) hours as needed for anxiety.      . Magnesium 250 MG TABS Take 250 mg by mouth 2 (two) times daily.      . methocarbamol (ROBAXIN) 500 MG tablet Take 500 mg by mouth.      Marland Kitchen omeprazole (PRILOSEC) 20 MG capsule Take 20 mg by mouth.      .  polyethylene glycol (MIRALAX / GLYCOLAX) packet Take 17 g by mouth at bedtime.      . polyvinyl alcohol (LIQUIFILM TEARS) 1.4 % ophthalmic solution Place 1 drop into both eyes as needed for dry eyes.      . potassium chloride (K-DUR) 10 MEQ tablet Take 20 mg by mouth.      . pramipexole (MIRAPEX) 0.5 MG tablet Take 0.5 mg by mouth 2 (two) times daily.      . QUEtiapine (SEROQUEL) 25 MG tablet Take 25 mg by mouth at bedtime.      . traMADol (ULTRAM) 50 MG tablet Take 50 mg by mouth every 6 (six) hours as needed (Pain).      Marland Kitchen warfarin (COUMADIN) 5 MG tablet Take 5 mg by mouth daily.       No current facility-administered medications for this visit.   Allergies as of 03/09/2014 - Review Complete 03/09/2014  Allergen Reaction Noted  . Wellbutrin [bupropion] Nausea Only 01/26/2014    reports that she has never smoked. She has never used smokeless tobacco. She reports that she does not drink alcohol or use illicit drugs.   Review of Systems: Pertinent positive and negative review of systems were noted in the above HPI section. All other review of systems were otherwise negative.  Vital signs were reviewed in today's medical record Physical Exam: General: Well developed , well nourished, no acute distress Skin: anicteric Head: Normocephalic and atraumatic Eyes:  sclerae anicteric, EOMI Ears: Normal auditory acuity Mouth: No deformity or lesions Neck: Supple, no masses or thyromegaly Lungs: Clear throughout to auscultation Heart: Regular rate and rhythm; no murmurs, rubs or bruits Abdomen: Soft, non tender and non distended. No masses, hepatosplenomegaly or hernias noted. Normal Bowel sounds Rectal: Grade 3 hemorrhoids are present.  There are no masses.  Stool is Hemoccult negative. Musculoskeletal: Symmetrical with no gross deformities  Skin: No lesions on visible extremities Pulses:  Normal pulses noted Extremities: No clubbing, cyanosis, edema or deformities noted Neurological:  Alert  oriented x 4, grossly nonfocal Cervical Nodes:  No significant cervical adenopathy Inguinal Nodes: No significant inguinal adenopathy Psychological:  Alert and cooperative. Normal mood and affect  See Assessment and Plan under Problem List

## 2014-03-09 NOTE — Assessment & Plan Note (Signed)
It appears that patient's neoplasm that is arising in the pelvis raising the question of a primary GYN malignancy, rectal neoplasm, or urologic malignancy.  Plan to proceed with sigmoidoscopy

## 2014-03-09 NOTE — Patient Instructions (Addendum)
You have been scheduled for a flexible sigmoidoscopy. Please follow the written instructions given to you at your visit today. If you use inhalers (even only as needed), please bring them with you on the day of your procedure.  STAY ON BLOOD THINNER PER DR Deatra Ina

## 2014-03-09 NOTE — Telephone Encounter (Signed)
Pts daughter called to clarify upcoming procedure. Discussed with Daughter the following: Procedure EUA,-, D&C, and endocervical biospy on Aug 7, Dr. Alinda Money will also perform a cystoscopy and ureteroscopy. Pt will see Dr. Deatra Ina at [facility] today 7/28 at 3pm for a consultation, and possible colonoscopy to better evaluate pelvic mass.  Transvaginal US results will be reviewed by MD and we will call pt's daughter [name] with results. [name] verbalized understanding. Called Dr. Guillermina City office to confirm pt appt and faxed Dr. Serita Grit last progress note Dr. Guillermina City CMA Shirlean Mylar fax# [phone]

## 2014-03-09 NOTE — Assessment & Plan Note (Signed)
Limited rectal bleeding suggests hemorrhoidal bleeding.  At the same time, the rectum from a neoplasm is a concern in view of her pathology.  Recommendations #1 sigmoidoscopy; patient will continue Lovenox.  I believe this limited exam will examine the area in question.

## 2014-03-09 NOTE — Assessment & Plan Note (Signed)
Patient has a history of congestive heart failure and valvular heart disease.  Cardiac function appear stable.  Although she is at high risk for colonoscopy, I think a limited sigmoidoscopy can be done safely.  No sedation will be required.

## 2014-03-10 ENCOUNTER — Institutional Professional Consult (permissible substitution): Payer: Medicare Other | Admitting: Radiation Oncology

## 2014-03-10 ENCOUNTER — Ambulatory Visit (AMBULATORY_SURGERY_CENTER): Payer: Medicare Other | Admitting: Gastroenterology

## 2014-03-10 ENCOUNTER — Telehealth: Payer: Self-pay | Admitting: *Deleted

## 2014-03-10 ENCOUNTER — Other Ambulatory Visit: Payer: Self-pay | Admitting: Urology

## 2014-03-10 ENCOUNTER — Encounter: Payer: Self-pay | Admitting: Gastroenterology

## 2014-03-10 VITALS — BP 139/61 | HR 63 | Temp 98.4°F | Resp 20 | Ht 61.0 in | Wt 201.0 lb

## 2014-03-10 DIAGNOSIS — D126 Benign neoplasm of colon, unspecified: Secondary | ICD-10-CM

## 2014-03-10 DIAGNOSIS — K648 Other hemorrhoids: Secondary | ICD-10-CM

## 2014-03-10 DIAGNOSIS — K625 Hemorrhage of anus and rectum: Secondary | ICD-10-CM

## 2014-03-10 DIAGNOSIS — K5730 Diverticulosis of large intestine without perforation or abscess without bleeding: Secondary | ICD-10-CM

## 2014-03-10 MED ORDER — SODIUM CHLORIDE 0.9 % IV SOLN: 500.0000 mL | INTRAVENOUS | Status: DC

## 2014-03-10 NOTE — Op Note (Signed)
[place]  [address] [place], [id number]   FLEXIBLE SIGMOIDOSCOPY PROCEDURE REPORT  PATIENT: Nichole Pena, Nichole Pena  MR#: [id number] BIRTHDATE: 03-Feb-1930 , 83  yrs. old GENDER: Female ENDOSCOPIST: Inda Castle, MD REFERRED BY: PROCEDURE DATE:  03/10/2014 PROCEDURE:   Sigmoidoscopy, diagnostic ASA CLASS:   Class III INDICATIONS: MEDICATIONS:  DESCRIPTION OF PROCEDURE:   After the risks benefits and alternatives of the procedure were thoroughly explained, informed consent was obtained.  revealed no abnormalities of the rectum. The LB 180 Scope [id number]  endoscope was introduced through the anus and advanced to the sigmoid colon, approximately 30 cm from the anus .  No adverse events experienced.   The quality of the prep was    .  The instrument was then slowly withdrawn as the mucosa was fully examined.       1.  COLON FINDINGS: In the distal sigmoid there was a 8 mm polyp.  Few diverticula were seen in the distal sigmoid.  The remainder of the exam including the proximal sigmoid and rectal vault were normal. Retroflexed view of the rectum demonstrated internal hemorrhoids.     The scope was then withdrawn from the patient and the procedure terminated.  COMPLICATIONS: There were no complications.  ENDOSCOPIC IMPRESSION: 1.  benign-appearing sigmoid 2.  diverticulosis 3.  internal hemorrhoids  There is no evidence for a GI malignancy in the distal colon  RECOMMENDATIONS: CT colonography to rule out polyps in the more proximal colon  REPEAT EXAM:   _______________________________ eSignedInda Castle, MD 03/10/2014 11:26 AM   CC: Everitt Amber, MD, Harvie Bridge, MD  PATIENT NAME:  Nichole Pena, Nichole Pena MR#: [id number]

## 2014-03-10 NOTE — Telephone Encounter (Signed)
[facility] regarding Procedure. Dr. Alinda Money unavailable on Aug 7, Procedure moved to [place] Aug 11,2015. Called pt's Daughter [name] and gave new procedure date. [name] verbalized understanding, confirmed. No further concerns.

## 2014-03-10 NOTE — Progress Notes (Signed)
No IV needed per Dr Deatra Ina.

## 2014-03-10 NOTE — Patient Instructions (Signed)
YOU HAD AN ENDOSCOPIC PROCEDURE TODAY AT THE [facility]: Refer to the procedure report that was given to you for any specific questions about what was found during the examination.  If the procedure report does not answer your questions, please call your gastroenterologist to clarify.  If you requested that your care partner not be given the details of your procedure findings, then the procedure report has been included in a sealed envelope for you to review at your convenience later.  YOU SHOULD EXPECT: Some feelings of bloating in the abdomen. Passage of more gas than usual.  Walking can help get rid of the air that was put into your GI tract during the procedure and reduce the bloating. If you had a lower endoscopy (such as a colonoscopy or flexible sigmoidoscopy) you may notice spotting of blood in your stool or on the toilet paper. If you underwent a bowel prep for your procedure, then you may not have a normal bowel movement for a few days.  DIET: Your first meal following the procedure should be a light meal and then it is ok to progress to your normal diet.  A half-sandwich or bowl of soup is an example of a good first meal.  Heavy or fried foods are harder to digest and may make you feel nauseous or bloated.  Likewise meals heavy in dairy and vegetables can cause extra gas to form and this can also increase the bloating.  Drink plenty of fluids but you should avoid alcoholic beverages for 24 hours.  ACTIVITY: Your care partner should take you home directly after the procedure.  You should plan to take it easy, moving slowly for the rest of the day.  You can resume normal activity the day after the procedure however you should NOT DRIVE or use heavy machinery for 24 hours (because of the sedation medicines used during the test).    SYMPTOMS TO REPORT IMMEDIATELY: A gastroenterologist can be reached at any hour.  During normal business hours, 8:30 AM to 5:00 PM Monday through Friday,  call [phone].  After hours and on weekends, please call the GI answering service at [phone] who will take a message and have the physician on call contact you.   Following lower endoscopy (colonoscopy or flexible sigmoidoscopy):  Excessive amounts of blood in the stool  Significant tenderness or worsening of abdominal pains  Swelling of the abdomen that is new, acute  Fever of 100F or higher  FOLLOW UP: If any biopsies were taken you will be contacted by phone or by letter within the next 1-3 weeks.  Call your gastroenterologist if you have not heard about the biopsies in 3 weeks.  Our staff will call the home number listed on your records the next business day following your procedure to check on you and address any questions or concerns that you may have at that time regarding the information given to you following your procedure. This is a courtesy call and so if there is no answer at the home number and we have not heard from you through the emergency physician on call, we will assume that you have returned to your regular daily activities without incident.  SIGNATURES/CONFIDENTIALITY: You and/or your care partner have signed paperwork which will be entered into your electronic medical record.  These signatures attest to the fact that that the information above on your After Visit Summary has been reviewed and is understood.  Full responsibility of the confidentiality of this   discharge information lies with you and/or your care-partner.  Diverticulosis, high fiber diet, internal hemorrhoids-handouts given  CT colonography to rule out polyps in the more proximal colon.

## 2014-03-10 NOTE — Progress Notes (Signed)
Pt did not have sedation.

## 2014-03-11 ENCOUNTER — Telehealth: Payer: Self-pay | Admitting: *Deleted

## 2014-03-11 NOTE — Telephone Encounter (Signed)
  Follow up Call-  Call back number 03/10/2014  Post procedure Call Back phone  # [phone]  Permission to leave phone message Yes     Patient questions:  Do you have a fever, pain , or abdominal swelling? No. Pain Score  0 *  Have you tolerated food without any problems? Yes.    Have you been able to return to your normal activities? Yes.    Do you have any questions about your discharge instructions: Diet   No. Medications  No. Follow up visit  No.  Do you have questions or concerns about your Care? No.  Actions: * If pain score is 4 or above: No action needed, pain <4.  Patient's telephone line busy, then called daughter, POA. Daughter stating as far as she knows patient has done well. Daughter to call if any problems.

## 2014-03-12 ENCOUNTER — Encounter (HOSPITAL_COMMUNITY): Payer: Self-pay | Admitting: Pharmacy Technician

## 2014-03-12 ENCOUNTER — Telehealth: Payer: Self-pay | Admitting: Gastroenterology

## 2014-03-12 ENCOUNTER — Telehealth: Payer: Self-pay | Admitting: Gynecologic Oncology

## 2014-03-12 ENCOUNTER — Other Ambulatory Visit: Payer: Self-pay

## 2014-03-12 DIAGNOSIS — Z1211 Encounter for screening for malignant neoplasm of colon: Secondary | ICD-10-CM

## 2014-03-12 NOTE — Telephone Encounter (Signed)
Pt scheduled for virtual colon at [facility] 03/24/14@8am , arrive at 7:45am. Pt to pickup prep at least 3 days prior to procedure. Daughter states this day may not work, she needs to check with Dr. Denman George. Daughter given the phone number [phone] in case she needs to reschedule the virtual colon.

## 2014-03-12 NOTE — Telephone Encounter (Signed)
Called patient to touch base on test results thus far - normal pap smear, US showed fluid collection in uterus. Plan is for [facility] and cervical biopsy under anesthesia (she will have held lovenox for 12 hours preprocedure) on 03/23/14. Dr Alinda Money will perform cystoscopy and ureteroscopy. The combined results from this should allow for [place] to move forward with definitive treatment planning with radiation oncology.  [name], MD

## 2014-03-16 ENCOUNTER — Encounter (HOSPITAL_COMMUNITY)
Admission: RE | Admit: 2014-03-16 | Discharge: 2014-03-16 | Disposition: A | Discharge status: Home / Self Care | Payer: Medicare Other | Source: Ambulatory Visit | Attending: Gynecologic Oncology | Admitting: Gynecologic Oncology

## 2014-03-16 ENCOUNTER — Encounter (HOSPITAL_COMMUNITY): Payer: Self-pay

## 2014-03-16 DIAGNOSIS — Z1211 Encounter for screening for malignant neoplasm of colon: Secondary | ICD-10-CM | POA: Insufficient documentation

## 2014-03-16 DIAGNOSIS — Z01818 Encounter for other preprocedural examination: Secondary | ICD-10-CM | POA: Insufficient documentation

## 2014-03-16 DIAGNOSIS — Z01812 Encounter for preprocedural laboratory examination: Secondary | ICD-10-CM | POA: Insufficient documentation

## 2014-03-16 HISTORY — DX: Dependence on supplemental oxygen: Z99.81

## 2014-03-16 LAB — CBC
HCT: 38.5 % (ref 36.0–46.0)
HEMOGLOBIN: 11.9 g/dL — AB (ref 12.0–15.0)
MCH: 28.6 pg (ref 26.0–34.0)
MCHC: 30.9 g/dL (ref 30.0–36.0)
MCV: 92.5 fL (ref 78.0–100.0)
Platelets: 230 10*3/uL (ref 150–400)
RBC: 4.16 MIL/uL (ref 3.87–5.11)
RDW: 14 % (ref 11.5–15.5)
WBC: 7.3 10*3/uL (ref 4.0–10.5)

## 2014-03-16 LAB — BASIC METABOLIC PANEL
Anion gap: 10 (ref 5–15)
BUN: 13 mg/dL (ref 6–23)
CALCIUM: 9.4 mg/dL (ref 8.4–10.5)
CO2: 29 mEq/L (ref 19–32)
Chloride: 102 mEq/L (ref 96–112)
Creatinine, Ser: 1.34 mg/dL — ABNORMAL HIGH (ref 0.50–1.10)
GFR calc Af Amer: 42 mL/min — ABNORMAL LOW (ref >90)
GFR calc non Af Amer: 36 mL/min — ABNORMAL LOW (ref >90)
Glucose, Bld: 94 mg/dL (ref 70–99)
Potassium: 4.1 mEq/L (ref 3.7–5.3)
Sodium: 141 mEq/L (ref 137–147)

## 2014-03-16 LAB — PROTIME-INR
INR: 1.19 (ref 0.00–1.49)
Prothrombin Time: 15.1 seconds (ref 11.6–15.2)

## 2014-03-16 LAB — APTT: APTT: 40 s — AB (ref 24–37)

## 2014-03-16 NOTE — Pre-Procedure Instructions (Addendum)
03-16-14 EKG 3'15/ Echo 4'15  reports with chart. CXR  6'15 - Epic.

## 2014-03-16 NOTE — Pre-Procedure Instructions (Addendum)
03-16-14 1530 "Nichole Pena" of [facility] confirmed receipt of Pacemaker Cardiac Device orders. [name] 03-18-14 Office notes faxed with Telephone Pacemaker check and signed with" NL Function" " Battery okay" noted. [name]

## 2014-03-16 NOTE — Patient Instructions (Signed)
20 Nichole Pena  03/16/2014   Your procedure is scheduled on:  8-11 -2015 [place] at [place] through [facility] Entrance and follow signs to [place]. Arrive at       0530  AM.  Call this number if you have problems the morning of surgery: [phone]  Or Presurgical Testing [phone](Nichole Pena) For Living Will and/or Health Care Power Attorney Forms: please provide copy for your medical record,may bring AM of surgery(Forms should be already notarized -we do not provide this service).(03-16-14 Yes/ will not  Provide, contained in Living Trust-family aware).  Remember: Follow any bowel prep instructions per MD office.(Clear Liquids x24 hours prior to surgery)-start 03-22-14 Monday AM.. For Cpap use: Bring mask and tubing only.   Do not eat food:After Midnight.       CLEAR LIQUID DIET   Foods Allowed                                                                     Foods Excluded  Coffee and tea, regular and decaf                             liquids that you cannot  Plain Jell-O in any flavor                                             see through such as: Fruit ices (not with fruit pulp)                                     milk, soups, orange juice  Iced Popsicles                                    All solid food Carbonated beverages, regular and diet                                    Cranberry, grape and apple juices Sports drinks like Gatorade Lightly seasoned clear broth or consume(fat free) Sugar, honey syrup  Sample Menu Breakfast                                Lunch                                     Supper Cranberry juice                    Beef broth                            Chicken broth Jell-O  Grape juice                           Apple juice Coffee or tea                        Jell-O                                      Popsicle                                                Coffee or tea                         Coffee or tea  _____________________________________________________________________    Take these medicines the morning of surgery with A SIP OF WATER: Tylenol(if needed). Amiodarone. Lexapro. Nexium. Mirapex. Hydrocodone(if needed). Tramadol(if needed). Use Lovenox injections per Dr. Serita Grit instructions.Use eye drops as usual.   Do not wear jewelry, make-up or nail polish.  Do not wear lotions, powders, or perfumes. You may wear deodorant.  Do not shave 48 hours(2 days) prior to first CHG shower(legs and under arms).(Shaving face and neck okay.)  Do not bring valuables to the hospital.(Hospital is not responsible for lost valuables).  Contacts, dentures or removable bridgework, body piercing, hair pins may not be worn into surgery.  Leave suitcase in the car. After surgery it may be brought to your room.  For patients admitted to the hospital, checkout time is 11:00 AM the day of discharge.(Restricted visitors-Any Persons displaying flu-like symptoms or illness).    Patients discharged the day of surgery will not be allowed to drive home. Must have responsible person with you x 24 hours once discharged.  Name and phone number of your driver: Nichole Pena,daughter [phone] cell  Special Instructions: CHG(Chlorhedine 4%-"Hibiclens","Betasept","Aplicare") Shower Use Special Wash: see special instructions.(avoid face and genitals)   Please read over the following fact sheets that you were given: Incentive Spirometry Instruction.  Remember : Type/Screen "Blue armbands" - may not be removed once applied(would result in being retested AM of surgery, if removed).  Failure to follow these instructions may result in Cancellation of your surgery.   __________________________    [facility] - Preparing for Surgery Before surgery, you can play an important role.  Because skin is not sterile, your skin needs to be as free of germs as possible.  You can reduce the number of germs on your  skin by washing with CHG (chlorahexidine gluconate) soap before surgery.  CHG is an antiseptic cleaner which kills germs and bonds with the skin to continue killing germs even after washing. Please DO NOT use if you have an allergy to CHG or antibacterial soaps.  If your skin becomes reddened/irritated stop using the CHG and inform your nurse when you arrive at Short Stay. Do not shave (including legs and underarms) for at least 48 hours prior to the first CHG shower.  You may shave your face/neck. Please follow these instructions carefully:  1.  Shower with CHG Soap the night before surgery and the  morning of Surgery.  2.  If you choose to wash your hair, wash your hair first as usual with  your  normal  shampoo.  3.  After you shampoo, rinse your hair and body thoroughly to remove the  shampoo.                           4.  Use CHG as you would any other liquid soap.  You can apply chg directly  to the skin and wash                       Gently with a scrungie or clean washcloth.  5.  Apply the CHG Soap to your body ONLY FROM THE NECK DOWN.   Do not use on face/ open                           Wound or open sores. Avoid contact with eyes, ears mouth and genitals (private parts).                       Wash face,  Genitals (private parts) with your normal soap.             6.  Wash thoroughly, paying special attention to the area where your surgery  will be performed.  7.  Thoroughly rinse your body with warm water from the neck down.  8.  DO NOT shower/wash with your normal soap after using and rinsing off  the CHG Soap.                9.  Pat yourself dry with a clean towel.            10.  Wear clean pajamas.            11.  Place clean sheets on your bed the night of your first shower and do not  sleep with pets. Day of Surgery : Do not apply any lotions/deodorants the morning of surgery.  Please wear clean clothes to the hospital/surgery center.  FAILURE TO FOLLOW THESE INSTRUCTIONS MAY  RESULT IN THE CANCELLATION OF YOUR SURGERY PATIENT SIGNATURE_________________________________  NURSE SIGNATURE__________________________________  ________________________________________________________________________   Adam Phenix  An incentive spirometer is a tool that can help keep your lungs clear and active. This tool measures how well you are filling your lungs with each breath. Taking long deep breaths may help reverse or decrease the chance of developing breathing (pulmonary) problems (especially infection) following:  A long period of time when you are unable to move or be active. BEFORE THE PROCEDURE   If the spirometer includes an indicator to show your best effort, your nurse or respiratory therapist will set it to a desired goal.  If possible, sit up straight or lean slightly forward. Try not to slouch.  Hold the incentive spirometer in an upright position. INSTRUCTIONS FOR USE  1. Sit on the edge of your bed if possible, or sit up as far as you can in bed or on a chair. 2. Hold the incentive spirometer in an upright position. 3. Breathe out normally. 4. Place the mouthpiece in your mouth and seal your lips tightly around it. 5. Breathe in slowly and as deeply as possible, raising the piston or the ball toward the top of the column. 6. Hold your breath for 3-5 seconds or for as long as possible. Allow the piston or ball to fall to the bottom of the column. 7. Remove the mouthpiece from your mouth  and breathe out normally. 8. Rest for a few seconds and repeat Steps 1 through 7 at least 10 times every 1-2 hours when you are awake. Take your time and take a few normal breaths between deep breaths. 9. The spirometer may include an indicator to show your best effort. Use the indicator as a goal to work toward during each repetition. 10. After each set of 10 deep breaths, practice coughing to be sure your lungs are clear. If you have an incision (the cut made at the  time of surgery), support your incision when coughing by placing a pillow or rolled up towels firmly against it. Once you are able to get out of bed, walk around indoors and cough well. You may stop using the incentive spirometer when instructed by your caregiver.  RISKS AND COMPLICATIONS  Take your time so you do not get dizzy or light-headed.  If you are in pain, you may need to take or ask for pain medication before doing incentive spirometry. It is harder to take a deep breath if you are having pain. AFTER USE  Rest and breathe slowly and easily.  It can be helpful to keep track of a log of your progress. Your caregiver can provide you with a simple table to help with this. If you are using the spirometer at home, follow these instructions: [place] IF:   You are having difficultly using the spirometer.  You have trouble using the spirometer as often as instructed.  Your pain medication is not giving enough relief while using the spirometer.  You develop fever of 100.5 F (38.1 C) or higher. SEEK IMMEDIATE MEDICAL CARE IF:   You cough up bloody sputum that had not been present before.  You develop fever of 102 F (38.9 C) or greater.  You develop worsening pain at or near the incision site. MAKE SURE YOU:   Understand these instructions.  Will watch your condition.  Will get help right away if you are not doing well or get worse. Document Released: 12/10/2006 Document Revised: 10/22/2011 Document Reviewed: 02/10/2007 [facility] Patient Information 2014 [place], [place].   ________________________________________________________________________

## 2014-03-17 ENCOUNTER — Inpatient Hospital Stay
Admit: 2014-03-17 | Discharge: 2014-03-17 | Disposition: A | Discharge status: Home / Self Care | Payer: Medicare Other | Attending: Gastroenterology | Admitting: Gastroenterology

## 2014-03-17 DIAGNOSIS — Z1211 Encounter for screening for malignant neoplasm of colon: Secondary | ICD-10-CM

## 2014-03-18 ENCOUNTER — Telehealth: Payer: Self-pay | Admitting: *Deleted

## 2014-03-18 NOTE — Progress Notes (Addendum)
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  Biopsies revealed:  02/02/14 Diagnosis Lymph node, needle/core biopsy, left peri-nephric - CARCINOMA. - SEE MICROSCOPIC DESCRIPTION.  Past/Anticipated interventions by Gyn/Onc surgery, if any: 02/02/14 - Procedure: CYSTOSCOPY WITH LEFT  RETROGRADE AND LEFT  STENT PLACEMENT;  Surgeon: Malka So, MD;  Location: [facility];  Service: Urology;  Laterality: Left; sigmoidoscopy 03/10/14, DILATATION AND CURETTAGE/ENDOCERVICAL BIOPSY by Dr. Denman George 03/23/14, CYSTOSCOPY WITH LEFT URETEROSCOPY LEFT URETERAL STENT CHANGE POSSIBLE LEFT URETERAL BIOPSY by Dr. Alinda Money 03/23/14  Past/Anticipated interventions by medical oncology, if any: sees Dr. Bobby Rumpf in [place].  Weight changes, if any: has lost 9 lbs due to colon preps in last few weeks.  Bowel/Bladder complaints, if any: history of rectal bleeding for 6 months, constipation.  Has burning with urination from her procedures yesterday.  Nausea/Vomiting, if any: yes  Pain issues, if any:  Has pain 5/10 with urination.  SAFETY ISSUES:  Prior radiation? no  Pacemaker/ICD? yes  Possible current pregnancy? no  Is the patient on methotrexate? no  Current Complaints / other details:  Patient is here with her 2 daughters.  She has 5 children.  She would like to have treatment in [place] due to transportation issues.  Patient has left chest pacemaker but does not have her pacemaker card with her.  Her cardiologist is Dr. Agustin Cree in [place].

## 2014-03-18 NOTE — Telephone Encounter (Signed)
Called [name]- pt's daughter discussed lovenox instructions prior to surgery -Hold Monday PM dose, Hold Tuesday AM dose. [name] verbalized understanding. No further concerns.

## 2014-03-19 ENCOUNTER — Ambulatory Visit (HOSPITAL_BASED_OUTPATIENT_CLINIC_OR_DEPARTMENT_OTHER): Admission: RE | Admit: 2014-03-19 | Payer: Medicare Other | Source: Ambulatory Visit | Admitting: Gynecologic Oncology

## 2014-03-19 ENCOUNTER — Encounter (HOSPITAL_BASED_OUTPATIENT_CLINIC_OR_DEPARTMENT_OTHER): Admission: RE | Payer: Self-pay | Source: Ambulatory Visit

## 2014-03-19 SURGERY — EXAM UNDER ANESTHESIA
Anesthesia: Choice

## 2014-03-23 ENCOUNTER — Ambulatory Visit (HOSPITAL_COMMUNITY): Payer: Medicare Other

## 2014-03-23 ENCOUNTER — Encounter (HOSPITAL_COMMUNITY): Payer: Self-pay | Admitting: *Deleted

## 2014-03-23 ENCOUNTER — Encounter (HOSPITAL_COMMUNITY): Payer: Medicare Other | Admitting: Registered Nurse

## 2014-03-23 ENCOUNTER — Ambulatory Visit (HOSPITAL_COMMUNITY)
Admission: RE | Admit: 2014-03-23 | Discharge: 2014-03-23 | Disposition: A | Discharge status: Home / Self Care | Payer: Medicare Other | Source: Ambulatory Visit | Attending: Gynecologic Oncology | Admitting: Gynecologic Oncology

## 2014-03-23 ENCOUNTER — Ambulatory Visit (HOSPITAL_COMMUNITY): Payer: Medicare Other | Admitting: Registered Nurse

## 2014-03-23 ENCOUNTER — Encounter (HOSPITAL_COMMUNITY)
Admission: RE | Disposition: A | Discharge status: Home / Self Care | Payer: Self-pay | Source: Ambulatory Visit | Attending: Gynecologic Oncology

## 2014-03-23 DIAGNOSIS — I4891 Unspecified atrial fibrillation: Secondary | ICD-10-CM | POA: Diagnosis not present

## 2014-03-23 DIAGNOSIS — F329 Major depressive disorder, single episode, unspecified: Secondary | ICD-10-CM | POA: Insufficient documentation

## 2014-03-23 DIAGNOSIS — G4733 Obstructive sleep apnea (adult) (pediatric): Secondary | ICD-10-CM | POA: Diagnosis not present

## 2014-03-23 DIAGNOSIS — C539 Malignant neoplasm of cervix uteri, unspecified: Secondary | ICD-10-CM | POA: Insufficient documentation

## 2014-03-23 DIAGNOSIS — Z9989 Dependence on other enabling machines and devices: Secondary | ICD-10-CM | POA: Insufficient documentation

## 2014-03-23 DIAGNOSIS — F3289 Other specified depressive episodes: Secondary | ICD-10-CM | POA: Diagnosis not present

## 2014-03-23 DIAGNOSIS — N135 Crossing vessel and stricture of ureter without hydronephrosis: Secondary | ICD-10-CM | POA: Diagnosis present

## 2014-03-23 DIAGNOSIS — Z96659 Presence of unspecified artificial knee joint: Secondary | ICD-10-CM | POA: Insufficient documentation

## 2014-03-23 DIAGNOSIS — K219 Gastro-esophageal reflux disease without esophagitis: Secondary | ICD-10-CM | POA: Insufficient documentation

## 2014-03-23 DIAGNOSIS — I2789 Other specified pulmonary heart diseases: Secondary | ICD-10-CM | POA: Insufficient documentation

## 2014-03-23 DIAGNOSIS — C801 Malignant (primary) neoplasm, unspecified: Secondary | ICD-10-CM

## 2014-03-23 DIAGNOSIS — F411 Generalized anxiety disorder: Secondary | ICD-10-CM | POA: Diagnosis not present

## 2014-03-23 DIAGNOSIS — M129 Arthropathy, unspecified: Secondary | ICD-10-CM | POA: Insufficient documentation

## 2014-03-23 DIAGNOSIS — Z954 Presence of other heart-valve replacement: Secondary | ICD-10-CM | POA: Insufficient documentation

## 2014-03-23 HISTORY — PX: EXAMINATION UNDER ANESTHESIA: SHX1540

## 2014-03-23 HISTORY — PX: CYSTOSCOPY W/ URETERAL STENT PLACEMENT: SHX1429

## 2014-03-23 HISTORY — PX: CYSTOSCOPY WITH URETEROSCOPY: SHX5123

## 2014-03-23 HISTORY — PX: DILATION AND CURETTAGE OF UTERUS: SHX78

## 2014-03-23 SURGERY — EXAM UNDER ANESTHESIA
Anesthesia: General

## 2014-03-23 MED ORDER — PROMETHAZINE HCL 25 MG/ML IJ SOLN: 6.2500 mg | INTRAMUSCULAR | Status: DC | PRN

## 2014-03-23 MED ORDER — SODIUM CHLORIDE 0.9 % IJ SOLN
INTRAMUSCULAR | Status: AC
  Filled 2014-03-23: qty 10

## 2014-03-23 MED ORDER — FENTANYL CITRATE 0.05 MG/ML IJ SOLN
INTRAMUSCULAR | Status: AC
  Filled 2014-03-23: qty 2

## 2014-03-23 MED ORDER — SODIUM CHLORIDE 0.9 % IR SOLN
Status: DC | PRN
  Administered 2014-03-23: 1000 mL

## 2014-03-23 MED ORDER — FENTANYL CITRATE 0.05 MG/ML IJ SOLN
INTRAMUSCULAR | Status: DC | PRN
  Administered 2014-03-23 (×2): 25 ug via INTRAVENOUS
  Administered 2014-03-23: 50 ug via INTRAVENOUS
  Administered 2014-03-23 (×2): 25 ug via INTRAVENOUS

## 2014-03-23 MED ORDER — PROPOFOL 10 MG/ML IV BOLUS
INTRAVENOUS | Status: AC
  Filled 2014-03-23: qty 20

## 2014-03-23 MED ORDER — CIPROFLOXACIN IN D5W 400 MG/200ML IV SOLN
400.0000 mg | Freq: Once | INTRAVENOUS | Status: AC
  Administered 2014-03-23: 400 mg via INTRAVENOUS

## 2014-03-23 MED ORDER — LIDOCAINE HCL (CARDIAC) 20 MG/ML IV SOLN
INTRAVENOUS | Status: AC
  Filled 2014-03-23: qty 5

## 2014-03-23 MED ORDER — IOHEXOL 300 MG/ML  SOLN
INTRAMUSCULAR | Status: DC | PRN
  Administered 2014-03-23: 10 mL

## 2014-03-23 MED ORDER — EPHEDRINE SULFATE 50 MG/ML IJ SOLN
INTRAMUSCULAR | Status: AC
  Filled 2014-03-23: qty 1

## 2014-03-23 MED ORDER — FENTANYL CITRATE 0.05 MG/ML IJ SOLN
INTRAMUSCULAR | Status: AC
  Filled 2014-03-23: qty 5

## 2014-03-23 MED ORDER — ONDANSETRON HCL 4 MG/2ML IJ SOLN
INTRAMUSCULAR | Status: DC | PRN
  Administered 2014-03-23: 4 mg via INTRAVENOUS

## 2014-03-23 MED ORDER — ONDANSETRON HCL 4 MG/2ML IJ SOLN
INTRAMUSCULAR | Status: AC
  Filled 2014-03-23: qty 2

## 2014-03-23 MED ORDER — 0.9 % SODIUM CHLORIDE (POUR BTL) OPTIME
TOPICAL | Status: DC | PRN
  Administered 2014-03-23: 2000 mL

## 2014-03-23 MED ORDER — LACTATED RINGERS IV SOLN
INTRAVENOUS | Status: DC | PRN
  Administered 2014-03-23 (×2): via INTRAVENOUS

## 2014-03-23 MED ORDER — SODIUM CHLORIDE 0.9 % IR SOLN
Status: DC | PRN
  Administered 2014-03-23: 3000 mL

## 2014-03-23 MED ORDER — LIDOCAINE HCL (CARDIAC) 20 MG/ML IV SOLN
INTRAVENOUS | Status: DC | PRN
Start: 2014-03-23 — End: 2014-03-23
  Administered 2014-03-23: 70 mg via INTRAVENOUS

## 2014-03-23 MED ORDER — ATROPINE SULFATE 0.4 MG/ML IJ SOLN
INTRAMUSCULAR | Status: AC
  Filled 2014-03-23: qty 2

## 2014-03-23 MED ORDER — CIPROFLOXACIN IN D5W 400 MG/200ML IV SOLN
INTRAVENOUS | Status: AC
  Filled 2014-03-23: qty 200

## 2014-03-23 MED ORDER — OXYCODONE-ACETAMINOPHEN 2.5-325 MG PO TABS: 1.0000 | ORAL_TABLET | ORAL | Status: DC | PRN

## 2014-03-23 MED ORDER — PROPOFOL 10 MG/ML IV BOLUS
INTRAVENOUS | Status: DC | PRN
Start: 2014-03-23 — End: 2014-03-23
  Administered 2014-03-23: 150 mg via INTRAVENOUS

## 2014-03-23 MED ORDER — FENTANYL CITRATE 0.05 MG/ML IJ SOLN
25.0000 ug | INTRAMUSCULAR | Status: DC | PRN
  Administered 2014-03-23 (×3): 50 ug via INTRAVENOUS

## 2014-03-23 SURGICAL SUPPLY — 34 items
BAG URO CATCHER STRL LF (DRAPE) ×3 IMPLANT
BRUSH URET BIOPSY 3F (UROLOGICAL SUPPLIES) ×3 IMPLANT
CATH INTERMIT  6FR 70CM (CATHETERS) ×3 IMPLANT
CATH ROBINSON RED A/P 16FR (CATHETERS) IMPLANT
CLOTH BEACON ORANGE TIMEOUT ST (SAFETY) ×3 IMPLANT
CONT SPEC 4OZ CLIKSEAL STRL BL (MISCELLANEOUS) ×6 IMPLANT
COVER SURGICAL LIGHT HANDLE (MISCELLANEOUS) ×3 IMPLANT
DRAPE CAMERA CLOSED 9X96 (DRAPES) ×3 IMPLANT
DRSG TELFA 3X8 NADH (GAUZE/BANDAGES/DRESSINGS) ×6 IMPLANT
ELECT LLETZ BALL 3MM DISP (ELECTRODE) ×3 IMPLANT
ELECT REM PT RETURN 9FT ADLT (ELECTROSURGICAL) ×3
ELECTRODE REM PT RTRN 9FT ADLT (ELECTROSURGICAL) ×2 IMPLANT
GLOVE BIO SURGEON STRL SZ 6.5 (GLOVE) ×3 IMPLANT
GLOVE BIO SURGEON STRL SZ7.5 (GLOVE) ×6 IMPLANT
GLOVE BIOGEL M STRL SZ7.5 (GLOVE) IMPLANT
GLOVE BIOGEL PI IND STRL 7.0 (GLOVE) ×2 IMPLANT
GLOVE BIOGEL PI INDICATOR 7.0 (GLOVE) ×1
GOWN STRL REUS W/TWL LRG LVL3 (GOWN DISPOSABLE) ×6 IMPLANT
GUIDEWIRE STR DUAL SENSOR (WIRE) ×6 IMPLANT
MANIFOLD NEPTUNE II (INSTRUMENTS) ×3 IMPLANT
NEEDLE SPNL 22GX3.5 QUINCKE BK (NEEDLE) IMPLANT
PACK CYSTO (CUSTOM PROCEDURE TRAY) ×6 IMPLANT
PACK MINOR VAGINAL W LONG (CUSTOM PROCEDURE TRAY) ×3 IMPLANT
PENCIL BUTTON HOLSTER BLD 10FT (ELECTRODE) ×3 IMPLANT
PIPET BIOPSY ENDOMETRIAL 3MM (SUCTIONS) ×3 IMPLANT
SHEATH ACCESS URETERAL 38CM (SHEATH) ×3 IMPLANT
STENT URET 6FRX24 CONTOUR (STENTS) ×3 IMPLANT
SUT VIC AB 0 CT1 27 (SUTURE) ×2
SUT VIC AB 0 CT1 27XBRD ANTBC (SUTURE) ×4 IMPLANT
SYR CONTROL 10ML LL (SYRINGE) IMPLANT
TUBING CONNECTING 10 (TUBING) ×3 IMPLANT
UNDERPAD 30X30 INCONTINENT (UNDERPADS AND DIAPERS) IMPLANT
WATER STERILE IRR 1500ML POUR (IV SOLUTION) IMPLANT
WIRE COONS/BENSON .038X145CM (WIRE) IMPLANT

## 2014-03-23 NOTE — Anesthesia Postprocedure Evaluation (Signed)
  Anesthesia Post-op Note  Patient: Nichole Pena  Procedure(s) Performed: Procedure(s): EXAM UNDER ANESTHESIA (N/A) DILATATION AND CURETTAGE/ENDOCERVICAL BIOPSY  (N/A) CYSTOSCOPY WITH LEFT URETEROSCOPY LEFT URETERAL STENT CHANGE, LEFT RENAL PELVIS WASHINGS,BLADDER WASHINGS AND LEFT URETERAL BRUSH BIOPSY (Left)  Patient Location: PACU  Anesthesia Type:General  Level of Consciousness: awake, alert  and oriented  Airway and Oxygen Therapy: Patient Spontanous Breathing  Post-op Pain: mild  Post-op Assessment: Post-op Vital signs reviewed  Post-op Vital Signs: Reviewed  Last Vitals:  Filed Vitals:   03/23/14 1040  BP: 164/74  Pulse: 68  Temp:   Resp: 16    Complications: No apparent anesthesia complications

## 2014-03-23 NOTE — H&P (Signed)
Chief Complaint Left ureteral obstruction with carcinoma of unknown primary origin   History of Present Illness Nichole Pena is a pleasant 80-year-old female seen today at the request of Dr. 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 Nichole Pena initially developed significant left lower extremity edema approximately 3 months ago. She has denied any pain associated with her edema but did undergo an evaluation through her primary care physician, Dr. Prochnau, with a Doppler ultrasound that was negative for a DVT. She was noted to have a large 4.9 cm Baker's cyst. She also has been chronically anticoagulated due to her history of atrial fibrillation and mechanical heart valve. She subsequently underwent a CT scan of the abdomen and pelvis on 12/30/13 for further evaluation of her left lower extremity edema. This revealed findings including left hydronephrosis with significant left renal cortical thinning consistent with chronic obstruction. She did appear to have a retroperitoneal process in the periureteral area concerning for retroperitoneal fibrosis or malignancy. In addition, there was noted to be significant inflammatory stranding along the course of the left ureter. There is noted to be fluid in the endometrial space raising concern for cervical obstruction. Finally, she was also noted to have a 1.5 x 1.2 cm left inguinal lymph node. She has denied any hematuria and has had only some mild left upper quadrant abdominal pain. There is no family history of GU malignancy.    There was note of a potential referral to orthopedic surgery to address her Baker's cyst although she states she has not yet to this. She has been treated with diuresis to help her left lower extremity edema. This is improved but not resolved.    On 02/02/14, she was taken to the operating room by Dr. Wrenn and underwent cystoscopy with left  retrograde pyelography which demonstrated a large segment of narrowing from the left renal pelvis to the iliac vessels consistent with obstruction possibly from an extrinsic source. He attempted to place a 6 x 24 double-J ureteral stent but the ureter would not accommodate it and therefore he was able to place a 4.8 x 24 French ureteral stent. Since then, she has had some mild expected stent symptoms including urinary frequency/urgency. On the same day, she underwent a percutaneous CT-guided biopsy of the periureteral tissue just medial to the proximal left ureter. The pathology report demonstrated carcinoma with focal features to suggest adenocarcinoma. Overall, the tissue sampled was relatively scant and non-definitive based on my conversation today with Dr. John Patrick who read the slides (Acc # [id number]).     A whole body F-18 FDG PET scan was performed on 02/17/14. This indicated no evidence of pulmonary nodules. There was uptake (SUV 5.2) of the periureteral tissue extending from the retroperitoneum down to the pelvis. There was focal uptake (SUV 4.4) at the T6 vertebrae without a correlating CT abnormality. She did have some subcutaneous abdominal nodules likely consistent with her use of subcutaneous Lovenox recently. There was not noted to be appreciable uptake at the left inguinal lymph node.    She also relates a history of rectal bleeding over the past year which she describes as cyclical and occurring approximately every month. She did see her gastroenterologist, Dr. Gupta, in [place] who evaluated her and provided her suppositories to help treat her hemorrhoids. She has not undergone a colonoscopy in over 15 years. She denies any rectal bleeding over the last couple of months.    **   Her past medical history is complex and significant for atrial fibrillation which has been managed with rate control. She previously was treated with metoprolol but developed symptomatic bradycardia and  eventually had a pacemaker placed in 2009. Currently, she is off beta-blockade and is treated with amiodarone. She also has undergone a mechanical mitral valve repair in 2011 and is managed with warfarin chronically for this purpose as well. Her other medical comorbid conditions include obstructive sleep apnea managed with CPAP, history of pulmonary hypertension, and history of gastroesophageal reflux disease. She has also undergone a prior total knee arthroplasty.    [name] also has been evaluated by Dr. Dequincy Lewis (medical oncology) in [place]. They have begun to discuss treatment options based on the assumption that she has a urothelial primary.   Past Medical History Problems  1. History of Anxiety (300.00) 2. History of CPAP (continuous positive airway pressure) dependence (V46.8) 3. History of arthritis (V13.4) 4. History of atrial fibrillation (V12.59) 5. History of depression (V11.8) 6. History of esophageal reflux (V12.79) 7. History of pulmonary hypertension (V12.59) 8. History of Obstructive sleep apnea, adult (327.23)  Surgical History Problems  1. History of Blepharoplasty Upper Lid W/ Excessive Skin 2. History of Cataract Surgery 3. History of Cystoscopy With Insertion Of Ureteral Stent Left 4. History of Knee Replacement 5. History of Mitral Valve Replacement 6. History of Pacemaker Placement 7. History of Sinus Surgery 8. History of Tonsillectomy With Adenoidectomy  Current Meds 1. Anusol-HC 25 MG Rectal Suppository;  Therapy: (Recorded:02Jun2015) to Recorded 2. Hydrocodone-Acetaminophen TABS;  Therapy: (Recorded:02Jun2015) to Recorded 3. Iron TABS;  Therapy: (Recorded:02Jun2015) to Recorded 4. Lasix 40 MG Oral Tablet (Furosemide);  Therapy: (Recorded:02Jun2015) to Recorded 5. Lexapro 20 MG Oral Tablet (Escitalopram Oxalate);  Therapy: (Recorded:02Jun2015) to Recorded 6. Magnesium 500 MG Oral Tablet;  Therapy: (Recorded:02Jun2015) to Recorded 7.  Mirapex 0.5 MG Oral Tablet (Pramipexole Dihydrochloride);  Therapy: (Recorded:02Jun2015) to Recorded 8. NexIUM 40 MG Oral Capsule Delayed Release (Esomeprazole Magnesium);  Therapy: (Recorded:02Jun2015) to Recorded 9. Pacerone 200 MG Oral Tablet;  Therapy: (Recorded:02Jun2015) to Recorded 10. Polyethylene Glycol 3350 Oral Powder;   Therapy: 23Apr2015 to Recorded 11. Potassium Chloride 20 MEQ TBCR;   Therapy: (Recorded:02Jun2015) to Recorded 12. Proctozone-HC 2.5 % Rectal Cream;   Therapy: 22May2015 to Recorded 13. SEROquel 25 MG Oral Tablet (QUEtiapine Fumarate);   Therapy: (Recorded:02Jun2015) to Recorded 14. Stool Softener TABS;   Therapy: (Recorded:02Jun2015) to Recorded 15. TraMADol HCl TABS;   Therapy: (Recorded:02Jun2015) to Recorded 16. Tylenol Arthritis Pain 650 MG Oral Tablet Extended Release;   Therapy: (Recorded:02Jun2015) to Recorded 17. Vitamin D3 CAPS;   Therapy: (Recorded:02Jun2015) to Recorded 18. Warfarin Sodium 5 MG Oral Tablet;   Therapy: 17Feb2015 to Recorded  Allergies Medication  1. Gabapentin CAPS 2. Wellbutrin TABS  Family History Problems  1. Family history of congestive heart failure (V17.49) : Mother 2. Family history of depression (V17.0) 3. Family history of pancreatic cancer (V16.0) : Father  Social History Problems    Denied: History of Alcohol use   Never a smoker   Retired   Widowed  Review of Systems Genitourinary, constitutional, skin, eye, otolaryngeal, hematologic/lymphatic, cardiovascular, pulmonary, endocrine, musculoskeletal, gastrointestinal, neurological and psychiatric system(s) were reviewed and pertinent findings if present are noted.  Genitourinary: no hematuria.  Gastrointestinal: no nausea, no vomiting, no diarrhea and no constipation.  Constitutional: no fever, no night sweats and no recent weight loss.  Cardiovascular: leg swelling, but no chest pain.  Respiratory: no shortness of breath and no cough.     Musculoskeletal: no back pain.    Vitals 14Jul2015 08:33AM  Blood Pressure: 128 / 74  Heart Rate: 84   Physical Exam Constitutional: Well nourished and well developed . No acute distress.   ENT:. The ears and nose are normal in appearance. Hearing loss is noted. She does have bruising on her cheeks bilaterally which was reportedly from a recent fall when she was somewhat dehydrated.   Neck: The appearance of the neck is normal and no neck mass is present.   Pulmonary: No respiratory distress, normal respiratory rhythm and effort and clear bilateral breath sounds.   Cardiovascular: Heart rate and rhythm are normal . No peripheral edema.   Abdomen: No masses are palpated. Mild tenderness in the LUQ is present. No CVA tenderness. No hernias are palpable. No hepatosplenomegaly noted.   Genitourinary:  Chaperone Present: Heather S.  Examination of the external genitalia shows normal female external genitalia. There is no urethral mass. Vaginal exam demonstrates atrophy. No cystocele is identified. No rectocele is identified. I am unable to palpate her cervix. There is some induration located in the periurethral anterior vaginal tissue although this feels very nonspecific and without any definite mass noted. The bladder is normal on palpation, non tender and without masses. The anus is normal on inspection. The rectum is not tender. The perineum is normal on inspection. No perineal tenderness is present.   Lymphatics: The supraclavicular, axillary, femoral and inguinal nodes are not enlarged or tender.   Skin: Normal skin turgor, no visible rash and no visible skin lesions.   Neuro/Psych:. Mood and affect are appropriate. No focal sensory deficits.    Results/Data Selected Results  COMPREHENSIVE METABOLIC PANEL 14Jul2015 09:47AM ,   SPECIMEN TYPE: BLOOD   Test Name Result Flag Reference  GLUCOSE 99 mg/dL  70-99  BUN 21 mg/dL  6-23  CREATININE 1.48 mg/dL H 0.50-1.40   SODIUM 143 mEq/L  135-145  POTASSIUM 3.9 mEq/L  3.5-5.3  CHLORIDE 104 mEq/L  96-112  CO2 31 mEq/L  19-32  CALCIUM 9.7 mg/dL  8.4-10.5  TOTAL PROTEIN 5.8 g/dL L 6.0-8.3  ALBUMIN 3.7 g/dL  3.5-5.2  AST/SGOT 25 U/L  0-37  ALT/SGPT 12 U/L  0-35  ALKALINE PHOSPHATASE 93 U/L  39-117  BILIRUBIN, TOTAL 0.4 mg/dL  0.2-1.2  Est GFR, African American 38 mL/min L   Est GFR, NonAfrican American 33 mL/min L   THE ESTIMATED GFR IS A CALCULATION VALID FOR ADULTS (>=18 YEARS OLD) THAT USES THE CKD-EPI ALGORITHM TO ADJUST FOR AGE AND SEX. IT IS   NOT TO BE USED FOR CHILDREN, PREGNANT WOMEN, HOSPITALIZED PATIENTS,    PATIENTS ON DIALYSIS, OR WITH RAPIDLY CHANGING KIDNEY FUNCTION. ACCORDING TO THE NKDEP, EGFR >89 IS NORMAL, 60-89 SHOWS MILD IMPAIRMENT, 30-59 SHOWS MODERATE IMPAIRMENT, 15-29 SHOWS SEVERE IMPAIRMENT AND <15 IS ESRD.   UA With REFLEX 14Jul2015 08:14AM ,   SPECIMEN TYPE: CLEAN CATCH   Test Name Result Flag Reference  COLOR YELLOW  YELLOW  APPEARANCE CLEAR  CLEAR  SPECIFIC GRAVITY >1.030 H 1.005-1.030  pH 5.5  5.0-8.0  GLUCOSE NEG mg/dL  NEG  BILIRUBIN SMALL A NEG  KETONE NEG mg/dL  NEG  BLOOD LARGE A NEG  PROTEIN 100 mg/dL A NEG  UROBILINOGEN 0.2 mg/dL  0.0-1.0  NITRITE NEG  NEG  LEUKOCYTE ESTERASE NEG  NEG  SQUAMOUS EPITHELIAL/HPF RARE  RARE  WBC 3-6 WBC/hpf A <3  RBC 7-10 RBC/hpf A <3  BACTERIA MODERATE A RARE  CRYSTALS NONE SEEN  NONE SEEN    CASTS NONE SEEN  NONE SEEN    Urine has been cultured.    I have reviewed her extensive medical records which are outlined above. I reviewed her recent laboratory studies which indicated a serum creatinine of 1.5 prior to ureteral stent placement. Her hemoglobin was 12.7. Platelet count 230,000. I reviewed her chest x-ray from 01/26/14 which demonstrated no evidence of pulmonary nodules. I have independently reviewed her CT scan from 12/30/13 estimated above.  Assessment Assessed  1. Ureteral obstruction (593.4) 2.  Carcinoma of unknown primary (199.1)  Plan Adenocarcinoma of ureter  1. Follow-up Office  Follow-up  Status: Hold For - Date of Service  Requested for: 14Jul2015 Ureteral obstruction  2. Follow-up Office  Follow-up - Will call after review of pathology  Status: Hold For - Date of  Service  Requested for: 14Jul2015 3. URINE CULTURE; Status:In Progress - Specimen/Data Collected;   Done: 14Jul2015 4. VENIPUNCTURE; Status:Complete;   Done: 14Jul2015  Discussion/Summary 1. Carcinoma of unknown primary origin: I had a long and detailed discussion with Ms. Elsayed and her two daughters today. We discussed her recent biopsy results which do confirm a diagnosis of carcinoma from the percutaneous biopsies in her periureteral/peri-renal pelvic tissue. There were findings noted on her pathology report that suggested possible adenocarcinoma and I discussed this over the phone with Dr. John Patrick today. Dr. Patrick plans to review the slides and will call me back to discuss further. I explained to the family that if there is a question of adenocarcinoma that this would likely not be of urothelial origin and may results in a different diagnosis and different treatment then urothelial carcinoma. Furthermore, her presentation is quite unusual for urothelial carcinoma in that she has had no hematuria especially being chronically anticoagulated, the presence of an enlarged left inguinal lymph node which would be unlikely related to urothelial cancer, and left lower extremity edema unrelated to DVT is an uncommon presenting symptom of urothelial cancer.    Dr. Patrick plans to call me back to see if there is enough tissue to submit for diagnostic molecular testing. We also discussed the possible benefit of proceeding with a biopsy of her inguinal lymph node or possibly by undergoing further urologic endoscopic evaluation to help clarify her diagnosis. Considering that her left inguinal lymph node was not PET-avid, a  biopsy of this area may be lower yield.     I also recommended that she proceed with further gynecologic evaluation considering the question of adenocarcinoma and the fact that she could not undergo a complete gynecologic evaluation by Dr. Kulish-Shaw recently. In addition, we discussed whether it may be important for her to consider a colonoscopy to rule out a colon primary as well although her rectal bleeding very well was related to hemorrhoids and chronic anticoagulation.     Following further discussion with Dr. Patrick and evaluation by GYN oncology, we will discuss the next appropriate step which may involve a percutaneous biopsy of her inguinal lymph node versus further urologic endoscopic evaluation with biopsies as indicated. We discussed that if she ultimately is thought to have a urothelial primary tumor that she would at least have locally advanced disease and the primary surgical therapy would have a low chance for cure. However, she may be a candidate for neoadjuvant chemotherapy followed by consolidative radical nephroureterectomy in that situation. If she ultimately is felt to have a non-urothelial primary tumor, she understands that urologic treatment would revolve more around the discussion of her left ureteral obstruction.      2. Left ureteral obstruction: She currently has an indwelling stent. I am skeptical that she has much relative renal function from the left kidney considering the significant cortical thinning of that kidney. Her renal function will be rechecked today. Her stent will be left in place pending further evaluation and decisions regarding treatment of her malignancy. Urine has been cultured today.      Note: She wishes to have her daughter Vicki (cell phone [phone]) be her primary contact for scheduling appointments, etc.    Cc: Dr. Caroline Proachnau  Dr. Dequincy Lewis  Dr. Robert Krasowski  Dr. John Wrenn  Dr. Christine Kulish-Shaw  

## 2014-03-23 NOTE — Anesthesia Preprocedure Evaluation (Addendum)
Anesthesia Evaluation  Patient identified by MRN, date of birth, ID band Patient awake    Reviewed: Allergy & Precautions, H&P , NPO status , Patient's Chart, lab work & pertinent test results  Airway Mallampati: II TM Distance: >3 FB Neck ROM: Full    Dental no notable dental hx.    Pulmonary shortness of breath, sleep apnea ,  breath sounds clear to auscultation  Pulmonary exam normal       Cardiovascular +CHF negative cardio ROS  + dysrhythmias Atrial Fibrillation + pacemaker Rhythm:Regular Rate:Normal  +Mechanical mitral valve replacement. Pacemaker dependent and interrogated. Magnet available.   Neuro/Psych PSYCHIATRIC DISORDERS Depression negative neurological ROS     GI/Hepatic Neg liver ROS, GERD-  Medicated,  Endo/Other  negative endocrine ROS  Renal/GU Renal disease  negative genitourinary   Musculoskeletal negative musculoskeletal ROS (+)   Abdominal   Peds negative pediatric ROS (+)  Hematology  (+) anemia ,   Anesthesia Other Findings   Reproductive/Obstetrics negative OB ROS                        Anesthesia Physical  Anesthesia Plan  ASA: III  Anesthesia Plan: General   Post-op Pain Management:    Induction: Intravenous  Airway Management Planned: LMA  Additional Equipment:   Intra-op Plan:   Post-operative Plan: Extubation in OR  Informed Consent: I have reviewed the patients History and Physical, chart, labs and discussed the procedure including the risks, benefits and alternatives for the proposed anesthesia with the patient or authorized representative who has indicated his/her understanding and acceptance.   Dental advisory given  Plan Discussed with: CRNA  Anesthesia Plan Comments:         Anesthesia Quick Evaluation

## 2014-03-23 NOTE — Interval H&P Note (Signed)
History and Physical Interval Note:  03/23/2014 7:45 AM  Nichole Pena  has presented today for surgery, with the diagnosis of PELVIC MASS  The various methods of treatment have been discussed with the patient and family. After consideration of risks, benefits and other options for treatment, the patient has consented to  Procedure(s): EXAM UNDER ANESTHESIA (N/A) DILATATION AND CURETTAGE/cervical cone  BIOPSY  (N/A) CYSTOSCOPY WITH LEFT URETEROSCOPY LEFT URETERAL STENT CHANGE POSSIBLE LEFT URETERAL BIOPSY   (Left) as a surgical intervention .  The patient's history has been reviewed, patient examined, no change in status, stable for surgery.  I have reviewed the patient's chart and labs.  Questions were answered to the patient's satisfaction.     [name]

## 2014-03-23 NOTE — Op Note (Signed)
Preoperative diagnosis:  1. Malignancy of unknown primary 2.  Left ureteral obstruction  Postoperative diagnosis: 1. Malignancy of unknown primary 2.  Left ureteral obstruction  Procedure(s): 1. Cystoscopy 2.  Left retrograde pyelography 3.  Bladder washing for cytology 4.  Left ureteroscopy with brush biopsy of left ureter 5.  Left renal pelvic washing for cytology 6.  Left ureteral stent ( 6 x 24) 7.  Pelvic exam under anesthesia  Surgeon: Dr. Roxy Horseman, Jr  Anesthesia: General  Complications: None  EBL: Minimal  Specimens: 1.  Bladder washing for cytology 2.  Brush biopsy of left ureter 3.  Left renal pelvic washing for cytology  Disposition of specimens: Pathology  Indication: Nichole Pena is an 80 year old female who recently presented with lower extremity edema and ultimately underwent CT imaging which incidentally detected evidence of left ureteral obstruction and a evidence of a possible mass in the retroperitoneum and pelvis. He was initially felt to possibly have an advanced urothelial carcinoma. Percutaneous biopsy revealed indeterminate findings suggesting a possible adenocarcinoma. In addition, her clinical presentation was not consistent with urothelial carcinoma.  She presents today to undergo further evaluation including endoscopic evaluation of her left ureter and renal pelvis if possible.  She has agreed to the above procedures.  Informed consent has been obtained.  Description of procedure:  The patient was taken to the operating room and a general anesthetic was administered.  She was given preoperative antibiotics, placed in the dorsal lithotomy position, and prepped and draped in the usual sterile fashion.  Next, a preoperative timeout was performed.  Cystourethroscopy was performed which revealed evidence of erythema surrounding the ureteral orifice and edema surrounding the left ureteral orifice consistent with her indwelling left ureteral stent.  Although no bladder tumor was identified, the patient was noted to have distortion of the left posterior bladder with what appeared to be an extrinsic process which appeared to pucker the bladder in this area and cause an abnormal appearance but without an intrinsic mass or other abnormality.  A bladder washing with saline was obtained for cytology.  Attention then turned to the left ureteral orifice.  The left ureteral stent was brought out to the urethral meatus and a 0.38 sensor guidewire was advanced through the ureteral stent up into the renal pelvis under fluoroscopic guidance.  I then made a gentle attempt to pass the inner sheath of the 12/14 ureteral access sheath and met significant resistance in the distal ureter.  I therefore removed the ureteral access sheath and performed semirigid ureteroscopy.  I was able to advance the ureteroscope into the distal portion of the mid ureter where upon the ureter was too narrow to allow further combination of the ureteroscope.  No intrinsic mass was identified to suggest obvious malignancy.  I used a brush biopsy to obtain a brushing of the proximal and mid ureter under fluoroscopic guidance.  I then placed a 6 French ureteral catheter over the wire into the renal pelvis and obtained a washing for cytology.  I then injected Omnipaque contrast via the ureteral catheter as it was gradually withdrawn.  The patient appeared to have a very narrow caliber ureter without intrinsic filling defects.  There was noted to be some narrowing of her infundibula which may have been her normal anatomic variant.  However, there was evidence of calyceal dilation possibly suggesting an obstructing process although nothing that suggested an obstructing process within the collecting system.  The wire was then guided back into the upper pole calyx.  The wire was back loaded on a cystoscope and a 6 x 26 double-J ureteral stent was advanced using Seldinger technique.  This was positioned  under fluoroscopic cystoscopic guidance.  The wire was removed and a good curl was noted in the renal pelvis as well as within the bladder.  The bladder was emptied.  Pelvic exam under anesthesia was performed.  No definite pelvic mass was identified although there was some thickening of the left bladder pedicle likely consistent with her ongoing process.   At this point, the procedure was turned over to Dr. Everitt Amber for her portion of the procedure which will be dictated separately.  The patient tolerated the urologic portion of the procedure well without complications.

## 2014-03-23 NOTE — Op Note (Signed)
PATIENT: Disaya Walt DATE: 03/23/14   Preop Diagnosis: adenocarcinoma of unknown primary  Postoperative Diagnosis: same  Surgery: Dilation and curettage, cervical biopsy  Surgeons:  [name], MD Assistant: none  Anesthesia: General   Estimated blood loss: 15 ml  IVF:  541ml   Urine output: 0 ml   Complications: None   Pathology: endometrial currettings; posterior cervical biopsy   Operative findings: vaginal mucosa grossly normal, though in the posterior fornix there is a palpable submucosal rigidity that corresponds to a mass filling the rectovaginal cul de sac on rectovaginal exam. The uterine cervix is normal in appearance and to palpate, though it is very tethered anteriorally and abdominally and is difficult to reach or manipulate. This tethering appears to come from this posterior left uterosacral mass. The uterine cavity sounded to 12cm with expulsion of large amount of clear fluid upon dilation of the cervix. Scant endometrial tissue retrieved.  Procedure: The patient was identified in the preoperative holding area. Informed consent was signed on the chart. Patient was seen history was reviewed and exam was performed.   The patient was then taken to the operating room and placed in the supine position with SCD hose on. General anesthesia was then induced without difficulty. She was then placed in the dorsolithotomy position. The perineum was prepped with Betadine. The vagina was prepped with Betadine. The patient was then draped after the prep was dried. A catheterization was not performed as she had just undergone bladder emptying during cysto.  Timeout was performed the patient, procedure, antibiotic, allergy, and length of procedure.   The weighted speculum was placed in the posterior vagina. The right angle retractor was placed anteriorally to visualize the cervix.  A single tooth tenaculum was placed with considerable difficulty (due to the  distortion of the cervical position at the apex of the vagina. The uterine sound was placed within endocervical canal. The pratts dilators were used to dilate the cervix to accomodate an endometrial curette (#1). Upon dilation of the cervix, a large amount of clear fluid was expelled from the uterine cavity. The curette was passed to the uterine fundus to gently curette the endometrial and endocervical walls. Due to the difficulty with angulation because of uterine tethering, an endometrial pipelle was also used to retrieve endometrial tissue (as it is a more flexible device).   The Kevorkian biopsy forcep was used to take 3 bites from the posterior ectocervix to randomly sample the cervical tissues that were in closest approximation with the deep posterior cervical mas. Hemostasis was noted.   The vagina was irrigated.  All instrument, suture, laparotomy, Ray-Tec, and needle counts were correct x2. The patient tolerated the procedure well and was taken recovery room in stable condition. This is Everitt Amber dictating an operative note on Arra Connaughton.

## 2014-03-23 NOTE — H&P (View-Only) (Signed)
Consult Note: Gyn-Onc  Consult was requested by Dr. Alinda Money for the evaluation of Nichole Pena 80 y.o. female with adenocarcinoma involving the left ureter, of unclear primary origin.  CC:  Chief Complaint  Patient presents with  . Mass    Assessment/Plan:  Nichole Pena  is a 80 y.o.  year old woman who is seen in consultation by Dr Alinda Money for adenocarcinoma involving and obstructing the left ureter of unclear primary.   Nichole Pena presents with a complex picture. My examination findings today are consistent with a malignant process involving the posterior uterus, cervix, and rectosigmoid colon. Because the cervix appears grossly normal and there is no uterine bleeding is unclear that this process primarily originated from GYN organs. However, her original CT of the pelvis in May 2015 did reveal and abnormally enlarged uterine cavity filled with fluid, the special stains from her periureteral biopsy stained positive for CK 7 and not CK 20 (which can be a marker for her primary GYN malignancy), and the cell type is unusual for a primary urothelial malignancy.   I have requested a CT and PET imaging be sent to my office so that I can personally view the pelvic anatomy and pathology, because her exam is somewhat limited due to body habitus. I feel that she would benefit from an exam under anesthesia, D&C and endocervical biopsy, however because of her unstable anticoagulation status (her current INR is 4.7) we will need to schedule this is an outpatient operative procedure on 03/19/14. I have discussed this with Dr. Alinda Money and he is interested in performing cystoscopy and ureteroscopy simultaneously. In the interim we have scheduled her for a transvaginal US to better characterize the pelvic anatomy and discern if she has a pelvic mass or gyn pathology. I have prescribed Lovenox for the patient to take (100 mg twice a day) so that bleeding becomes less of a risk during her anticipated upcoming procedures  and biopsies due to our ability to hold this medication acutely and restart quickly postprocedure.  I am concerned about the posterior mass, and a history of rectal bleeding in the past 6 months, being evidence of a primary colonic process. Due to her artificial heart valve she is felt to be a poor candidate for outpatient colonoscopy, however will see [facility] physicians for a consultation regarding this. If a GYN origin of tumo is not identified on intraoperative exam under anesthesia D&C and cervical biopsy, or molecular testing of her tumor pathology, then rectal cancer should be excluded.  Regardless of whether this is a primary GYN or colorectal tumor, surgery would not be curative in this patient given that she has an extensive pelvic and upper renal tract disease involvement. Instead it is likely that she will require primary chemoradiation to control the locally extensive process. We will make plans to ensure she is seen by radiation oncology for treatment planning.   HPI: Nichole Pena is an 80 year old woman who is seen in consultation at the request of Dr. Alinda Money of [facility] for her a left peri-ureteral adenocarcinoma of unclear origin. Her symptoms began approximately 3 months ago predominantly with left lower extremity edema. She reports no postmenopausal bleeding. She reports that she has had intermittent rectal bleeding (heavier than what she would expect from hemorrhoids) in the past 12 months (last episode was December 2014). She has never had an abnormal pap smear. She denies urinary or GI obstructive symptoms.   For her symptoms she underwent workup at [facility] in  [place] on 12/30/13 with lower extremity Dopplers, and CT imaging of the abdomen and pelvis. He this imaging showed severe left hydronephrosis, cortical thinning and inflammation/thickening along the entire length of the left ureter. There was retraction of the left ovary and left common iliac vein  by this process. Of note no discrete pelvic mass is commented on in the CT scan. The uterine cavity is described as being dilated with fluid. There were lymph nodes in the left inguinal region measuring 1.5 cm in greatest dimension.  A CT-guided percutaneous core needle biopsy of the left periureteral tissue was performed on 02/02/2014 this revealed adenocarcinoma strongly positive for CK 20 and P. 53 negative for CK 7. It was ER PR receptor negative. This pathology has been sent for her additional molecular testing to help elucidate primary tumor site.   She was also taken to the OR by Dr Jeffie Pollock (urology) in [place] on 02/02/14 for placement of a 4.8French ureteral stent.  A PET scan was performed on 02/17/2014 at [facility] and revealed an amorphous abnormal soft tissue attenuation tracking along the left ureter that was hypermetabolic, with no clear distant primary lesion if this was a metastatic lesion. The dilated fluid filled endometrial cavity was again observed but with no associated hypermetabolism within the uterus was cervix to suggest associated malignancy. There was a small hypermetabolic focus in the left T6 transverse process. The mild inguinal lymphadenopathy that had been seen on prior CT scan was not hypermetabolic on PET.  She was seen by Dr. Alinda Money on 02/23/2014 who felt that her presentation was quite unusual for primary urothelial carcinoma given that she has no hematuria and she has left inguinal lymphadenopathy and left lower stream he edema which are not commonly seen with urothelial cancer.  Of note, the patient has a history of atrial fibrillation and an artificial mitral valve replacement for which she requires constant therapeutic anticoagulation.  Interval History: She continues to feel generally well without pain, bleeding or progressive LE edema. Her INR at last check (yesterday) was elevated at 4.7.  Current Meds:  Outpatient Encounter Prescriptions as of  03/05/2014  Medication Sig  . acetaminophen (TYLENOL) 500 MG tablet Take 1,000 mg by mouth every 6 (six) hours as needed (Pain).  Marland Kitchen amiodarone (PACERONE) 200 MG tablet Take 200 mg by mouth every morning.   Marland Kitchen aspirin 81 MG chewable tablet Chew 81 mg by mouth.  . beta carotene w/minerals (OCUVITE) tablet Take 1 tablet by mouth daily.  . calcium citrate (CALCITRATE - DOSED IN MG ELEMENTAL CALCIUM) 950 MG tablet Take 1 tablet by mouth.  . Cholecalciferol (VITAMIN D) 2000 UNITS CAPS Take 2,000 Units by mouth daily.  Marland Kitchen docusate sodium (COLACE) 100 MG capsule Take 100 mg by mouth 2 (two) times daily.  . DULoxetine (CYMBALTA) 60 MG capsule Take 60 mg by mouth.  . enoxaparin (LOVENOX) 100 MG/ML injection Inject 1 mL (100 mg total) into the skin every 12 (twelve) hours.  Marland Kitchen escitalopram (LEXAPRO) 20 MG tablet Take 20 mg by mouth every morning.  Marland Kitchen esomeprazole (NEXIUM) 40 MG capsule Take 40 mg by mouth daily with breakfast.   . ferrous fumarate (FERRO-SEQUELS) 50 MG CR tablet Take 45 mg by mouth.  . ferrous sulfate 325 (65 FE) MG tablet Take 325 mg by mouth 2 (two) times daily.  . furosemide (LASIX) 40 MG tablet Take 40 mg by mouth daily.  Marland Kitchen HYDROcodone-acetaminophen (NORCO/VICODIN) 5-325 MG per tablet Take 1 tablet by mouth every  6 (six) hours as needed for moderate pain.  Marland Kitchen lisinopril (PRINIVIL,ZESTRIL) 10 MG tablet Take 10 mg by mouth.  Marland Kitchen LORazepam (ATIVAN) 0.5 MG tablet Take 0.5 mg by mouth every 8 (eight) hours as needed for anxiety.  . Magnesium 250 MG TABS Take 250 mg by mouth 2 (two) times daily.  . methocarbamol (ROBAXIN) 500 MG tablet Take 500 mg by mouth.  Marland Kitchen omeprazole (PRILOSEC) 20 MG capsule Take 20 mg by mouth.  . polyethylene glycol (MIRALAX / GLYCOLAX) packet Take 17 g by mouth at bedtime.  . polyvinyl alcohol (LIQUIFILM TEARS) 1.4 % ophthalmic solution Place 1 drop into both eyes as needed for dry eyes.  . potassium chloride (K-DUR) 10 MEQ tablet Take 20 mg by mouth.  . potassium  chloride SA (K-DUR,KLOR-CON) 20 MEQ tablet Take 20 mEq by mouth daily.  . pramipexole (MIRAPEX) 0.5 MG tablet Take 0.5 mg by mouth 2 (two) times daily.  . predniSONE (DELTASONE) 20 MG tablet Take 40 mg by mouth.  . QUEtiapine (SEROQUEL) 25 MG tablet Take 25 mg by mouth at bedtime.  . traMADol (ULTRAM) 50 MG tablet Take 50 mg by mouth every 6 (six) hours as needed (Pain).  Marland Kitchen warfarin (COUMADIN) 5 MG tablet Take 5 mg by mouth daily.  Marland Kitchen zolpidem (AMBIEN CR) 12.5 MG CR tablet Take 5 mg by mouth.  . [DISCONTINUED] enoxaparin (LOVENOX) 100 MG/ML injection Inject 100 mg into the skin.    Allergy:  Allergies  Allergen Reactions  . Wellbutrin [Bupropion] Nausea Only    Social Hx:   History   Social History  . Marital Status: Widowed    Spouse Name: N/A    Number of Children: 85  . Years of Education: N/A   Occupational History  . Retired    Social History Main Topics  . Smoking status: Never Smoker   . Smokeless tobacco: Never Used  . Alcohol Use: No  . Drug Use: No  . Sexual Activity: Not on file   Other Topics Concern  . Not on file   Social History Narrative  . No narrative on file    Past Surgical Hx:  Past Surgical History  Procedure Laterality Date  . Pacemaker insertion  2008    Mitral heart replacement  . Knee arthroscopy  2008    right knee  . Cataract extraction w/ intraocular lens implant Bilateral   . Cardiac valve replacement  2011  . Tonsillectomy    . Joint replacement Right 2008    partial knee  . Cystoscopy with retrograde pyelogram, ureteroscopy and stent placement Left 02/02/2014    Procedure: CYSTOSCOPY WITH LEFT  RETROGRADE AND LEFT  STENT PLACEMENT;  Surgeon: Malka So, MD;  Location: [facility];  Service: Urology;  Laterality: Left;    Past Medical Hx:  Past Medical History  Diagnosis Date  . Anemia   . Atrial fibrillation   . Mitral valve prolapse   . DDD (degenerative disc disease)   . DJD (degenerative joint disease)   . Depression   .  GERD (gastroesophageal reflux disease)   . Macular degeneration disease   . Cataract   . Hydronephrosis, left   . Pacemaker   . Sleep apnea   . Shortness of breath     shortness of breath- 01/26/14  states x years  . History of blood transfusion   . Leg edema, left 01/25/14    swollen about 3 x size of right leg- toe to hip/ moderate reddness anterior lower  leg, non draining- states has been like this    Past Gynecological History:  Menopause in her 70's, 5 SVD's. No prior abnormal pap smears.  No LMP recorded. Patient is postmenopausal.  Family Hx:  Family History  Problem Relation Age of Onset  . Pancreatic cancer Father   . Heart disease Mother   . Heart disease Sister     Review of Systems:  Constitutional  Feels well,  No acute complaints  ENT Normal appearing ears and nares bilaterally Skin/Breast  No rash, sores, jaundice, itching, dryness Cardiovascular  No chest pain, shortness of breath, or edema  Pulmonary  No cough or wheeze.  Gastro Intestinal  No nausea, vomitting, or diarrhoea. +bright red blood per rectum. no abdominal pain, change in bowel movement, or constipation.  Genito Urinary  No frequency, urgency, dysuria, see HPI Musculo Skeletal  No myalgia, arthralgia, or pain. + LLE edema Neurologic  No weakness, numbness, change in gait,  Psychology  No depression, anxiety, insomnia.   Vitals:  Blood pressure 163/84, pulse 71, temperature 97.6 F (36.4 C), temperature source Oral, resp. rate 18, height 5\' 2"  (1.575 m), weight 202 lb 4.8 oz (91.763 kg).  Physical Exam: WD in NAD Neck  Supple NROM, without any enlargements.  Lymph Node Survey No cervical supraclavicular or inguinal adenopathy Cardiovascular  Pulse normal rate, regularity and rhythm. Artficial valve clicks noted on ausculation. Lungs  Clear to auscultation bilateraly, without wheezes/crackles/rhonchi. Good air movement.  Skin  No rash/lesions/breakdown  Psychiatry  Alert and  oriented to person, place, and time  Abdomen  Normoactive bowel sounds, abdomen soft, non-tender and obese without evidence of hernia. No palpable fluid shift or masses. Back No CVA tenderness Genito Urinary  Vulva/vagina: Normal external female genitalia.  No lesions. No discharge or bleeding.  Bladder/urethra:  No lesions or masses, well supported bladder  Vagina: grossly normal, no lesions visible, no blood.  Cervix: Small, pulled/tethered anteriorally. No lesions. Palpably firm, but no lesions or discrete nodularity.  Uterus: Small, minimally mobile/somehwhat fixed. Posterior nodularity/confluence.  Adnexa: no adnexal masses. Rectal  Good tone, + culde sac nodularity/illdefined mass contiguous with uterus. Extremities  No bilateral cyanosis, clubbing or edema.   [name], MD   03/05/2014, 5:38 PM

## 2014-03-23 NOTE — Interval H&P Note (Signed)
History and Physical Interval Note:  03/23/2014 7:24 AM  Nichole Pena  has presented today for surgery, with the diagnosis of PELVIC MASS  The various methods of treatment have been discussed with the patient and family. After consideration of risks, benefits and other options for treatment, the patient has consented to  Procedure(s): EXAM UNDER ANESTHESIA (N/A) DILATATION AND CURETTAGE/ENDOCERVICAL BIOPSY  (N/A) CYSTOSCOPY WITH LEFT URETEROSCOPY LEFT URETERAL STENT CHANGE POSSIBLE LEFT URETERAL BIOPSY   (Left) as a surgical intervention .  The patient's history has been reviewed, patient examined, no change in status, stable for surgery.  I have reviewed the patient's chart and labs.  Questions were answered to the patient's satisfaction.     Lissete Maestas,LES

## 2014-03-23 NOTE — Transfer of Care (Signed)
Immediate Anesthesia Transfer of Care Note  Patient: Nichole Pena  Procedure(s) Performed: Procedure(s): EXAM UNDER ANESTHESIA (N/A) DILATATION AND CURETTAGE/ENDOCERVICAL BIOPSY  (N/A) CYSTOSCOPY WITH LEFT URETEROSCOPY LEFT URETERAL STENT CHANGE, LEFT RENAL PELVIS WASHINGS,BLADDER WASHINGS AND LEFT URETERAL BRUSH BIOPSY (Left)  Patient Location: PACU  Anesthesia Type:General  Level of Consciousness: awake, alert , oriented and patient cooperative  Airway & Oxygen Therapy: Patient Spontanous Breathing and Patient connected to face mask oxygen  Post-op Assessment: Report given to PACU RN, Post -op Vital signs reviewed and stable and Patient moving all extremities  Post vital signs: Reviewed and stable  Complications: No apparent anesthesia complications

## 2014-03-23 NOTE — Discharge Instructions (Addendum)
Urologic discharge instructions:  1. You may resume your blood thinner from a urologic standpoint although please follow Dr. Serita Grit instructions about when to resume your Coumadin and Lovenox. 2. You may see some blood in the urine and may have some burning with urination for 48-72 hours. You also may notice that you have to urinate more frequently or urgently after your procedure which is normal.  3. You should call should you develop an inability urinate, fever > 101, persistent nausea and vomiting that prevents you from eating or drinking to stay hydrated.  4. If you have a stent, you will likely urinate more frequently and urgently until the stent is removed and you may experience some discomfort/pain in the lower abdomen and flank especially when urinating. You may take pain medication prescribed to you if needed for pain. You may also intermittently have blood in the urine until the stent is removed.

## 2014-03-24 ENCOUNTER — Encounter: Payer: Self-pay | Admitting: Radiation Oncology

## 2014-03-24 ENCOUNTER — Ambulatory Visit
Admit: 2014-03-24 | Discharge: 2014-03-24 | Disposition: A | Discharge status: Home / Self Care | Payer: Medicare Other | Attending: Radiation Oncology | Admitting: Radiation Oncology

## 2014-03-24 ENCOUNTER — Other Ambulatory Visit: Payer: Medicare Other

## 2014-03-24 VITALS — BP 123/51 | HR 75 | Temp 97.8°F | Ht 61.0 in | Wt 202.9 lb

## 2014-03-24 DIAGNOSIS — F329 Major depressive disorder, single episode, unspecified: Secondary | ICD-10-CM | POA: Insufficient documentation

## 2014-03-24 DIAGNOSIS — Z950 Presence of cardiac pacemaker: Secondary | ICD-10-CM | POA: Insufficient documentation

## 2014-03-24 DIAGNOSIS — D649 Anemia, unspecified: Secondary | ICD-10-CM | POA: Diagnosis not present

## 2014-03-24 DIAGNOSIS — R609 Edema, unspecified: Secondary | ICD-10-CM | POA: Diagnosis not present

## 2014-03-24 DIAGNOSIS — Z510 Encounter for antineoplastic radiation therapy: Secondary | ICD-10-CM | POA: Diagnosis present

## 2014-03-24 DIAGNOSIS — Z79899 Other long term (current) drug therapy: Secondary | ICD-10-CM | POA: Insufficient documentation

## 2014-03-24 DIAGNOSIS — N1330 Unspecified hydronephrosis: Secondary | ICD-10-CM | POA: Insufficient documentation

## 2014-03-24 DIAGNOSIS — K219 Gastro-esophageal reflux disease without esophagitis: Secondary | ICD-10-CM | POA: Insufficient documentation

## 2014-03-24 DIAGNOSIS — Z7901 Long term (current) use of anticoagulants: Secondary | ICD-10-CM | POA: Diagnosis not present

## 2014-03-24 DIAGNOSIS — F3289 Other specified depressive episodes: Secondary | ICD-10-CM | POA: Insufficient documentation

## 2014-03-24 DIAGNOSIS — C801 Malignant (primary) neoplasm, unspecified: Secondary | ICD-10-CM

## 2014-03-24 DIAGNOSIS — I4891 Unspecified atrial fibrillation: Secondary | ICD-10-CM | POA: Insufficient documentation

## 2014-03-24 DIAGNOSIS — C763 Malignant neoplasm of pelvis: Secondary | ICD-10-CM | POA: Diagnosis not present

## 2014-03-24 DIAGNOSIS — Z954 Presence of other heart-valve replacement: Secondary | ICD-10-CM | POA: Diagnosis not present

## 2014-03-24 NOTE — Progress Notes (Signed)
Please see the Nurse Progress Note in the MD Initial Consult Encounter for this patient. 

## 2014-03-24 NOTE — Progress Notes (Signed)
Radiation Oncology         [phone] ________________________________  Initial outpatient Consultation  Name: Nichole Pena MRN: [id number]  Date: 03/24/2014  DOB: 1930/05/22  PQ:[id number], MD  Everitt Amber, MD   REFERRING PHYSICIAN: Everitt Amber, MD  DIAGNOSIS: The encounter diagnosis was Adenocarcinoma of unknown origin.  HISTORY OF PRESENT ILLNESS::Nichole Pena is a 80 y.o. female who is seen out courtesy of Dr. Denman George for an opinion concerning radiation therapy as part of management of patient's recently diagnosed adenocarcinoma of unknown origin presenting in the rectovaginal cul de sac.  the patient presented earlier this year with significant swelling in her left lower extremity. She was found to have left ureteral obstruction. She underwent subsequent ureteral stenting to address this issue. A CT scan at that time revealed severe left hydronephrosis with cortical thinning suggesting a long-standing process. Results noted to be severe inflammatory changes noted along the entire course of the left ureter. There is also retraction of the adjacent left ovary by this process as well as retraction and narrowing of the left common iliac vein. The patient was also noted to have enlarged lymph nodes in the left inguinal region which was felt to be inflammatory. Fluid was also noted within the endometrial space concerning for cervical obstruction. A PET scan was performed which revealed mild uptake in the amorphous left retroperitoneal soft tissue and also noted to be some uptake in the periureteral soft tissue along the left pelvis area.  no other areas of significant uptake were noted on the patient's PET scan.. the patient was taken to the operating room by urology and gynecologic oncology. The patient underwent cystoscopy left retrograde pyelography bladder washings for cytology, left ureteroscopy with brush biopsy of the left ureter, left renal pelvis washings for cytology, left ureteral  stent placement.  Exam under anesthesia yesterday showed the vaginal mucosa to be grossly normal. In the posterior fornix there was a palpable submucosal rigidity corresponding to a mass filling the  vaginal cul-de-sac on rectovaginal exam. The cervix was very tethered anteriorly. The tethering appeared to come from this posterior left uterosacral mass.. The uterine cavity sounded to 12 cm with a large amount of clear fluid released upon dilation of the cervix. Pathology included a posterior cervical biopsy as well as endometrial curettings. Pathology from the procedure yesterday is pending.  The patient has also undergone extensive GI evaluation with sigmoidoscopy and virtual colonoscopy with no colorectal primary. Patient was noted to have hemorrhoids.  With this information the patient is now seen in radiation oncology for consideration for treatment.  PREVIOUS RADIATION THERAPY: No  PAST MEDICAL HISTORY:  has a past medical history of Anemia (2014); Atrial fibrillation (2004); Mitral valve prolapse; DDD (degenerative disc disease); DJD (degenerative joint disease); Depression; GERD (gastroesophageal reflux disease); Macular degeneration disease; Cataract; Hydronephrosis, left; Pacemaker; Sleep apnea; Shortness of breath; History of blood transfusion; Leg edema, left (01/25/14); Arthritis; CHF (congestive heart failure); Diverticulosis; and History of oxygen administration.    PAST SURGICAL HISTORY: Past Surgical History  Procedure Laterality Date  . Pacemaker insertion  2008    Mitral heart replacement  . Knee arthroscopy Right 2008  . Cataract extraction w/ intraocular lens implant Bilateral   . Cardiac valve replacement  2011    mechanical  . Tonsillectomy    . Joint replacement Right 2008    partial knee  . Cystoscopy with retrograde pyelogram, ureteroscopy and stent placement Left 02/02/2014    Procedure: CYSTOSCOPY WITH LEFT  RETROGRADE AND  LEFT  STENT PLACEMENT;  Surgeon: Malka So, MD;   Location: [facility];  Service: Urology;  Laterality: Left;  . Dilation and curettage of uterus  03/23/14  . Cystoscopy w/ ureteral stent placement  03/23/14    FAMILY HISTORY: family history includes Heart disease in her mother and sister; Liver cancer in her father; Lung cancer in her father.  SOCIAL HISTORY:  reports that she has never smoked. She has never used smokeless tobacco. She reports that she does not drink alcohol or use illicit drugs.  ALLERGIES: Gabapentin and Wellbutrin  MEDICATIONS:  Current Outpatient Prescriptions  Medication Sig Dispense Refill  . acetaminophen (TYLENOL) 650 MG CR tablet Take 1,300 mg by mouth every 8 (eight) hours as needed for pain.      Marland Kitchen amiodarone (PACERONE) 200 MG tablet Take 200 mg by mouth every morning.       . beta carotene w/minerals (OCUVITE) tablet Take 1 tablet by mouth 2 (two) times daily.       . Cholecalciferol (VITAMIN D) 2000 UNITS CAPS Take 2,000 Units by mouth daily.      Marland Kitchen docusate sodium (COLACE) 100 MG capsule Take 100 mg by mouth 2 (two) times daily.      Marland Kitchen enoxaparin (LOVENOX) 100 MG/ML injection Inject 1 mL (100 mg total) into the skin every 12 (twelve) hours.  28 Syringe  1  . escitalopram (LEXAPRO) 20 MG tablet Take 20 mg by mouth every morning.      Marland Kitchen esomeprazole (NEXIUM) 40 MG capsule Take 40 mg by mouth daily with breakfast.       . furosemide (LASIX) 40 MG tablet Take 40 mg by mouth every morning.       Marland Kitchen HYDROcodone-acetaminophen (NORCO/VICODIN) 5-325 MG per tablet Take 1 tablet by mouth every 6 (six) hours as needed for moderate pain.      Marland Kitchen LORazepam (ATIVAN) 0.5 MG tablet Take 0.5 mg by mouth every 8 (eight) hours as needed for anxiety.      . polyethylene glycol (MIRALAX / GLYCOLAX) packet Take 17 g by mouth at bedtime.      . polyvinyl alcohol (LIQUIFILM TEARS) 1.4 % ophthalmic solution Place 1 drop into both eyes 2 (two) times daily as needed for dry eyes.       . potassium chloride (K-DUR) 10 MEQ tablet Take 20 mEq  by mouth.       . pramipexole (MIRAPEX) 0.5 MG tablet Take 0.5 mg by mouth 2 (two) times daily.      . QUEtiapine (SEROQUEL) 25 MG tablet Take 25 mg by mouth at bedtime.      . traMADol (ULTRAM) 50 MG tablet Take 50 mg by mouth every 6 (six) hours as needed (Pain).      . ferrous sulfate 325 (65 FE) MG tablet Take 325 mg by mouth 2 (two) times daily.      . Magnesium 250 MG TABS Take 250 mg by mouth 2 (two) times daily.      Marland Kitchen oxycodone-acetaminophen (PERCOCET) 2.5-325 MG per tablet Take 1 tablet by mouth every 4 (four) hours as needed for pain.  30 tablet  0  . warfarin (COUMADIN) 5 MG tablet Take 5 mg by mouth every evening.        No current facility-administered medications for this encounter.    REVIEW OF SYSTEMS:  A 15 point review of systems is documented in the electronic medical record. This was obtained by the nursing staff. However, I reviewed this with  the patient to discuss relevant findings and make appropriate changes.  The patient complains of pain deep within the pelvis. She rates this as 5/10 without pain medication. She has had intermittent rectal bleeding over the past one to 2 years. Left lower extremity edema is much better with her ureteral stent. She has been unsteady on her feet which is been a chronic problem. Patient can walk short distances and drives locally.  She can do light housework.   PHYSICAL EXAM:  height is 5\' 1"  (1.549 m) and weight is 202 lb 14.4 oz (92.035 kg). Her oral temperature is 97.8 F (36.6 C). Her blood pressure is 123/51 and her pulse is 75. Her oxygen saturation is 95%.  this is a very pleasant 80 year old female in no acute distress. She is somewhat hard of hearing. She is accompanied by her 2 daughters on evaluation today. Examination of the pupils reveals them to be equal and reactive to light. The extraocular eye movements are intact. The tongue is midline. No secondary infection noted the oral cavity or posterior pharynx. Examination of the neck  and supraclavicular region reveals no evidence of adenopathy. The axillary areas are free of adenopathy. Examination of the lungs reveals them to be clear. The heart has a regular rhythm and rate. Sounds are consistent with prior valve replacement. The patient has a pacemaker in place in the left upper chest. The abdomen is obese nontender with normal bowel sounds. Patient has some bruising from her Lovenox injections. There is a small proximally pea-sized nodule palpable in the left lateral abdominal wall. No palpable inguinal adenopathy. A pelvic exam is deferred in light of her recent exam under anesthesia yesterday. On neurological examination motor strength is 5 out of 5 in the proximal and distal muscle groups in the upper lower extremities. Peripheral pulses are adequate. Patient continues have some edema in the left lower extremity but apparently much better.  She has pitting edema in both ankle areas.    ECOG = 1   1 - Symptomatic but completely ambulatory (Restricted in physically strenuous activity but ambulatory and able to carry out work of a light or sedentary nature. For example, light housework, office work)   LABORATORY DATA:  Lab Results  Component Value Date   WBC 7.3 03/16/2014   HGB 11.9* 03/16/2014   HCT 38.5 03/16/2014   MCV 92.5 03/16/2014   PLT 230 03/16/2014   Lab Results  Component Value Date   NA 141 03/16/2014   K 4.1 03/16/2014   CL 102 03/16/2014   CO2 29 03/16/2014   GLUCOSE 94 03/16/2014   CREATININE 1.34* 03/16/2014   CALCIUM 9.4 03/16/2014      RADIOGRAPHY: US Transvaginal Non-ob  US Pelvis Complete  03/09/2014   CLINICAL DATA:  Evaluate for pelvic mass.  EXAM: TRANSVAGINAL ULTRASOUND OF PELVIS; US PELVIS COMPLETE  TECHNIQUE: Transvaginal ultrasound examination of the pelvis was performed including evaluation of the uterus, ovaries, adnexal regions, and pelvic cul-de-sac.  COMPARISON:  CT 01/01/2014 as well as PET-CT 02/17/2014  FINDINGS: Uterus  Measurements: 3.6 x 3.9 x  7.6 cm. There is a central fluid collection almost completely replacing the uterus likely originating from the endometrium measuring 2.7 x 3.3 x 4.9 cm. There is increased heterogeneous echogenicity along the inferior/ dependent portion of this fluid collection which may represent a solid component, although there is no internal vascularity over this echogenic portion. This region was not hypermetabolic on the recent PET-CT.  Endometrium  Not visualized.  Right  ovary  Not visualized.  Left ovary  Not visualized.  Other findings: Small amount of free pelvic fluid. Bladder is unremarkable.  IMPRESSION: Complex fluid collection centered over the endometrium measuring 2.7 x 3.3 x 4.9 cm. This may be secondary to cervical stenosis from previous procedures, however malignancy remains the diagnosis of exclusion. Recommend GYN consultation for possible hysteroscopy/ tissue sampling.  Small amount of free pelvic fluid.   Electronically Signed   By: Marin Olp M.D.   On: 03/09/2014 09:47   Dg Retrograde Pyelogram  03/23/2014   CLINICAL DATA:  Left hydronephrosis.  Left ureter biopsy.  EXAM: RETROGRADE PYELOGRAM  COMPARISON:  CT 03/17/2014  FINDINGS: Four images were obtained of the left upper renal collecting system. There is a catheter extending into the left renal pelvic region. There is mild dilatation of the left renal calices. Left renal pelvis is decompressed or small.  IMPRESSION: Mild dilatation of left renal calices. Left ureter catheter or stent.   Electronically Signed   By: Markus Daft M.D.   On: 03/23/2014 08:50   Ct Virtual Colonoscopy Screening  03/17/2014   TECHNIQUE: The patient was given a standard magnesium citrate and suppositories bowel preparation with Gastrografin and barium for fluid and stool tagging respectively. The quality of the bowel preparation is moderate to poor. Automated CO2 insufflation of the colon was performed prior to image acquisition and colonic distention is moderate to poor.  Image post processing was used to generate a 3D endoluminal fly-through projection of the colon and to electronically subtract stool/fluid as appropriate.  CLINICAL DATA:  History of blood per rectum over the last year, generalized abdominal pain, nausea, negative sigmoid is scope in July this year, positive a lymph node biopsy for adenocarcinoma, unknown primary  EXAM: CT VIRTUAL COLONOSCOPY SCREENING  COMPARISON:  CT abdomen pelvis of 01/01/2014 and PET-CT of 02/17/2014  FINDINGS: Both 2D and 3D images were performed. This patient does not tolerate gaseous distention of the colon well, and an additional right lateral decubitus series was performed. However despite multiple attempts, there are areas of the colon which simply do not distend and cannot be assessed by virtual colonoscopy therefore. The rectosigmoid colon is largely decompressed on all series with multiple diverticula present. There is an area of somewhat asymmetrically thickened soft tissue within the rectosigmoid colon, and although this could be due to diverticulosis, an underlying malignancy cannot be excluded. At approximately 40 cm from the anal verge there is an 8 mm polypoid lesion within a partially collapsed segment of colon suspicious for a true polyp. The more proximal colon is better visualized, and no other clinically significant polypoid lesion or constricting lesion is seen. The right colon and cecum are unremarkable.  IMPRESSION: 1. Suboptimal evaluation of several segments of the colon due to poor distention which can therefore not be assessed by virtual colonoscopy. 2. There is asymmetric thickening of the mucosa of a segment of the rectosigmoid colon which could be due to diverticulosis, but an underlying malignancy cannot be excluded. 3. Within a partially collapsed segment of colon at 40 cm from the anal verge there is a suspicious 8 mm polypoid lesion present. 4. The more proximal colon, particularly the distal transverse and  right colon, are well visualized with no clinically significant polypoid lesion demonstrated.  Virtual colonoscopy is not designed to detect diminutive polyps (i.e., less than or equal to 5 mm), the presence or absence of which may not affect clinical management.  CT ABDOMEN AND PELVIS WITHOUT CONTRAST  FINDINGS: The lung bases are clear. There is cardiomegaly present. Pacemaker wires are noted. There does appear to be a small hiatal hernia present and clinical correlation is recommended. A moderate amount of ascites is noted throughout the entire peritoneal cavity. Slight irregularity of the contours of the liver may indicate cirrhosis but no focal hepatic lesion is seen on this unenhanced study. No calcified gallstones are noted. The pancreas is normal in size and the pancreatic duct is not dilated. The adrenal glands and spleen are unremarkable. The stomach is decompressed. No abnormality of the right kidney is seen. A left ureteral stent is noted for treatment of previously diagnosed left hydronephrosis. Amorphous soft tissue courses along the left ureter throughout its length consistent with inflammation or tumor. The distal left ureteral stent extends into the decompressed urinary bladder. The abdominal aorta is normal in caliber.  A small amount of ascites layers within the pelvis. As noted on the prior CT and PET scan, there is fluid distending the atrophic appearing uterus in this elderly patient. A cervical lesion cannot be excluded.  The colon does not distend well in this patient and therefore there are multiple areas which cannot be assessed by virtual colonoscopy. There is an area within the sigmoid colon with somewhat irregularly thickened wall. This may be due to diverticulosis in this patient with rectosigmoid colon diverticula, but an underlying malignancy cannot be excluded. Multiple subcutaneous soft tissue nodules are present throughout the anterior abdomen and pelvis of uncertain significance.  Bone window images show significant degenerative change involving the SI joints. There is degenerative disc disease throughout the lumbar spine. Slight anterolisthesis of L5 on S1 is noted which appears to be due to degenerative change of the facet joints.  1. Moderate amount of ascites throughout the peritoneal cavity. Cannot exclude cirrhosis. Correlate with LFTs. 2. Multiple subcutaneous soft tissue nodules of uncertain etiology. 3. Left double-J ureteral stent present with soft tissue along the course of the stent consistent with inflammation or tumor. 4. Fluid fills the endometrial canal and a cervical lesion cannot be excluded. 5. Probable small hiatal hernia. 6. Somewhat asymmetric soft tissue within the sigmoid colon. Cannot exclude an underlying malignancy.   Electronically Signed   By: Ivar Drape M.D.   On: 03/17/2014 10:03      IMPRESSION: Adenocarcinoma of unknown primary presenting in the left rectovaginal cul-de-sac resulting in severe left hydronephrosis. Biopsies from procedure yesterday are pending.  The patient would likely be a candidate for radiation therapy directed to this region particularly since she is symptomatic with pain. This area for treatment is somewhat vague on scans without a discrete mass identifiable. Patient does live in the [place] area and would prefer to receive her radiation therapy at the [facility]. I will make a referral to Dr. Gatha Mayer for this treatment. The patient has already seen Dr. Bobby Rumpf at the [facility] and she may possibly receive chemotherapy as part of her overall management. Today I discussed general radiation therapy recommendations with the patient but may change once biopsies have returned.  PLAN: Referral to the [facility] for treatment.  Total time spent in this evaluation was 80 minutes including face-to-face evaluation with the patient, examination, review of in-house imaging and outside imaging as well  as multiple documents/procedures. ------------------------------------------------  Blair Promise, PhD, MD

## 2014-03-25 ENCOUNTER — Telehealth: Payer: Self-pay | Admitting: *Deleted

## 2014-03-25 ENCOUNTER — Encounter (HOSPITAL_COMMUNITY): Payer: Self-pay | Admitting: Gynecologic Oncology

## 2014-03-25 ENCOUNTER — Encounter (HOSPITAL_COMMUNITY): Payer: Self-pay

## 2014-03-25 NOTE — Telephone Encounter (Signed)
Call from pt's daughter [name] states " Have the pathology results came back from the procedure and when should mother follow up with Dr. Denman George?" Discussed with [name] the Pathology results were not back at this time. [name] requested she be call with results at work # [phone] or on her cell# [phone] as her mother (pt) seems to be forgetful at times and unable to relay all information received. Advised I will give this message her concerns to Dr. Denman George. No further concerns at this time

## 2014-03-25 NOTE — Addendum Note (Signed)
Encounter addended by: Jacqulyn Liner, RN on: 03/25/2014  9:48 AM<BR>     Documentation filed: Notes Section

## 2014-03-26 ENCOUNTER — Encounter: Payer: Self-pay | Admitting: *Deleted

## 2014-03-26 NOTE — Progress Notes (Signed)
[place] Psychosocial Distress Screening Clinical Social Work  Clinical Social Work was referred by distress screening protocol.  The patient scored a 5 on the Psychosocial Distress Thermometer which indicates mild distress. Clinical Social Worker attempted to phone pt to assess for distress and other psychosocial needs. Pt did not have a voicemail set up on either of her numbers. Per chart review, pt to be referred to [facility] center. CSW will continue to try to reach pt, not follow up appointments set for [facility].   ONCBCN DISTRESS SCREENING 03/24/2014  Screening Type Initial Screening  Elta [place] the number that describes how much distress you have been experiencing in the past week 5  Practical problem type Transportation;Food  Emotional problem type Depression;Nervousness/Anxiety;Adjusting to illness;Isolation/feeling alone;Boredom  Information Concerns Type Lack of info about diagnosis;Lack of info about treatment  Physical Problem type Pain;Nausea/vomiting;Getting around;Loss of appetitie;Constipation/diarrhea;Changes in urination;Swollen arms/legs  Physician notified of physical symptoms Yes     Clinical Social Worker follow up needed: yes  If yes, follow up plan:  Continue to try to contact Loren Racer, [place] Worker Josephina Shih. [place] for [facility] Wednesday, Thursday and Friday Phone: [phone] Fax: [phone]

## 2014-03-29 ENCOUNTER — Telehealth: Payer: Self-pay | Admitting: Oncology

## 2014-03-29 NOTE — Telephone Encounter (Signed)
Nichole Pena daughter, left a message saying that Dr. Alinda Money called with the pathology results which showed urethral cancer.  She said this is treated with chemotherapy alone.  She said they will not be going to any radiation appointments.

## 2014-03-30 ENCOUNTER — Telehealth: Payer: Self-pay | Admitting: Oncology

## 2014-03-30 ENCOUNTER — Telehealth: Payer: Self-pay | Admitting: Gynecologic Oncology

## 2014-03-30 NOTE — Telephone Encounter (Signed)
C/D 03/30/14 for appt. 04/02/14

## 2014-03-30 NOTE — Telephone Encounter (Signed)
S/W PATIENT/DTR AND GAVE NP APPT FOR 08/21 @ 10:30 W/DR. [place]

## 2014-03-30 NOTE — Telephone Encounter (Signed)
Informed Nichole Pena of results of endometrial and cervial biopsies (consistent with urothelial carcinoma). Plan is for her to proceed with chemotherapy with Dr Barbaraann Faster.  [name]

## 2014-04-01 ENCOUNTER — Telehealth: Payer: Self-pay | Admitting: Medical Oncology

## 2014-04-01 NOTE — Telephone Encounter (Signed)
Confirmation call to patient regarding tomorrow's appt. Patient knows to bring current list of medications as well as informed her of the free valet parking. Expresses understanding, denies questions at this time.

## 2014-04-02 ENCOUNTER — Encounter: Payer: Self-pay | Admitting: Oncology

## 2014-04-02 ENCOUNTER — Other Ambulatory Visit: Payer: Medicare Other

## 2014-04-02 ENCOUNTER — Ambulatory Visit: Payer: Medicare Other

## 2014-04-02 ENCOUNTER — Ambulatory Visit (HOSPITAL_BASED_OUTPATIENT_CLINIC_OR_DEPARTMENT_OTHER): Payer: Medicare Other | Admitting: Oncology

## 2014-04-02 VITALS — BP 138/47 | HR 69 | Temp 98.4°F | Resp 18 | Ht 61.0 in | Wt 206.0 lb

## 2014-04-02 DIAGNOSIS — C539 Malignant neoplasm of cervix uteri, unspecified: Secondary | ICD-10-CM

## 2014-04-02 DIAGNOSIS — I4891 Unspecified atrial fibrillation: Secondary | ICD-10-CM

## 2014-04-02 DIAGNOSIS — R1900 Intra-abdominal and pelvic swelling, mass and lump, unspecified site: Secondary | ICD-10-CM

## 2014-04-02 DIAGNOSIS — N135 Crossing vessel and stricture of ureter without hydronephrosis: Secondary | ICD-10-CM

## 2014-04-02 NOTE — Progress Notes (Signed)
Please see consult note.  

## 2014-04-02 NOTE — Consult Note (Signed)
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   HPI: This is a pleasant 80 year old woman currently lives in [place], [place] where she lived the majority of her life. She has past medical history significant for atrial fibrillation obesity, and other health issues. She was in her normal state of health when she started developing significant swelling in her left lower extremity. Her workup did not reveal any deep vein thrombosis and subsequently she was found to have left ureteral obstruction. She underwent subsequent ureteral stenting to address this issue. A CT scan at that time revealed severe left hydronephrosis with cortical thinning suggesting a long-standing process. Results noted to be severe inflammatory changes noted along the entire course of the left ureter. There is also retraction of the adjacent left ovary by this process as well as retraction and narrowing of the left common iliac vein. The patient was also noted to have enlarged lymph nodes in the left inguinal region which was felt to be inflammatory. Fluid was also noted within the endometrial space concerning for cervical obstruction. A PET scan was performed which revealed mild uptake in the amorphous left retroperitoneal soft tissue and also noted to be some uptake in the periureteral soft tissue along the left pelvis area. no other areas of significant uptake were noted on the patient's PET scan.. the patient was taken to the operating room by urology and gynecologic oncology. The patient underwent cystoscopy left retrograde pyelography bladder washings for cytology, left ureteroscopy with brush biopsy of the left ureter, left renal pelvis washings for cytology, left ureteral stent placement. Exam under anesthesia yesterday showed the vaginal mucosa to be grossly normal. In the posterior fornix there was a palpable submucosal rigidity corresponding to a mass filling the vaginal cul-de-sac on rectovaginal exam. The  cervix was very tethered anteriorly. The tethering appeared to come from this posterior left uterosacral mass.. The uterine cavity sounded to 12 cm with a large amount of clear fluid released upon dilation of the cervix. Pathology included a posterior cervical biopsy as well as endometrial curettings. Pathology from the procedure yesterday is pending. The patient has also undergone extensive GI evaluation with sigmoidoscopy and virtual colonoscopy with no colorectal primary. Her most recent pathology obtained on 03/23/2014 which are predominately renal washing she would malignancy suspicious for your testicular carcinoma. Patient was referred to me for evaluation regarding these findings.  Clinically, she reports few symptoms at this point. She complains of abdominal fullness on the left side and constipation. She also reported some erythema and skin rash on the right side. She is reported that no urination difficulty or discomfort she doesn't report quite a bit of straining. In that have resulted in  left-sided shoulder pain. She is report any fevers chills or sweats. She does report some decrease in her by mouth intake but no weight-loss. She does report any headaches, blurred vision, syncope, or seizures. She does report any chest pain, palpitation, orthopnea, PND, does report left leg edema. She does not report any nausea, vomiting, hematochezia or melena. She does not report any frequency urgency or hematuria. She does not report any arthralgias or myalgias or joint swelling. She is not report any petechiae or lymphadenopathy. She did not report any bleeding or clotting tendencies. She is under pretty mood difficulties. She still lives independently but she is requiring more help at home. She of systems unremarkable.   Past Medical History  Diagnosis Date  . Anemia 2014  . Atrial fibrillation 2004  .  Mitral valve prolapse   . DDD (degenerative disc disease)   . DJD (degenerative joint disease)   .  Depression   . GERD (gastroesophageal reflux disease)   . Macular degeneration disease   . Cataract   . Hydronephrosis, left   . Pacemaker   . Sleep apnea   . Shortness of breath     shortness of breath- 01/26/14  states x years  . History of blood transfusion   . Leg edema, left 01/25/14    swollen about 3 x size of right leg- toe to hip/ moderate reddness anterior lower leg, non draining- states has been like this.03-16-14 has improved someshat.  . Arthritis   . CHF (congestive heart failure)   . Diverticulosis   . History of oxygen administration     03-16-14 as needed at 3.5 l/m nasally. Not used lately.  :  Past Surgical History  Procedure Laterality Date  . Pacemaker insertion  2008    Mitral heart replacement  . Knee arthroscopy Right 2008  . Cataract extraction w/ intraocular lens implant Bilateral   . Cardiac valve replacement  2011    mechanical  . Tonsillectomy    . Joint replacement Right 2008    partial knee  . Cystoscopy with retrograde pyelogram, ureteroscopy and stent placement Left 02/02/2014    Procedure: CYSTOSCOPY WITH LEFT  RETROGRADE AND LEFT  STENT PLACEMENT;  Surgeon: Malka So, MD;  Location: [facility];  Service: Urology;  Laterality: Left;  . Dilation and curettage of uterus  03/23/14  . Cystoscopy w/ ureteral stent placement  03/23/14  . Examination under anesthesia N/A 03/23/2014    Procedure: EXAM UNDER ANESTHESIA;  Surgeon: Everitt Amber, MD;  Location: [facility];  Service: Gynecology;  Laterality: N/A;  . Dilation and curettage of uterus N/A 03/23/2014    Procedure: DILATATION AND CURETTAGE/ENDOCERVICAL BIOPSY ;  Surgeon: Everitt Amber, MD;  Location: [facility];  Service: Gynecology;  Laterality: N/A;  . Cystoscopy with ureteroscopy Left 03/23/2014    Procedure: CYSTOSCOPY WITH LEFT URETEROSCOPY LEFT URETERAL STENT CHANGE, LEFT RENAL PELVIS WASHINGS,BLADDER WASHINGS AND LEFT URETERAL BRUSH BIOPSY;  Surgeon: Raynelle Bring, MD;  Location: [facility];  Service: Urology;   Laterality: Left;  :  Current Outpatient Prescriptions  Medication Sig Dispense Refill  . acetaminophen (TYLENOL) 650 MG CR tablet Take 1,300 mg by mouth every 8 (eight) hours as needed for pain.      Marland Kitchen amiodarone (PACERONE) 200 MG tablet Take 200 mg by mouth every morning.       . beta carotene w/minerals (OCUVITE) tablet Take 1 tablet by mouth 2 (two) times daily.       Marland Kitchen docusate sodium (COLACE) 100 MG capsule Take 100 mg by mouth 2 (two) times daily.      Marland Kitchen escitalopram (LEXAPRO) 20 MG tablet Take 20 mg by mouth every morning.      Marland Kitchen esomeprazole (NEXIUM) 40 MG capsule Take 40 mg by mouth daily with breakfast.       . furosemide (LASIX) 40 MG tablet Take 40 mg by mouth every morning.       Marland Kitchen HYDROcodone-acetaminophen (NORCO/VICODIN) 5-325 MG per tablet Take 1 tablet by mouth every 6 (six) hours as needed for moderate pain.      Marland Kitchen LORazepam (ATIVAN) 0.5 MG tablet Take 0.5 mg by mouth every 8 (eight) hours as needed for anxiety.      . Magnesium 250 MG TABS Take 250 mg by mouth 2 (two) times daily.      Marland Kitchen  polyethylene glycol (MIRALAX / GLYCOLAX) packet Take 17 g by mouth at bedtime.      . polyvinyl alcohol (LIQUIFILM TEARS) 1.4 % ophthalmic solution Place 1 drop into both eyes 2 (two) times daily as needed for dry eyes.       . potassium chloride (K-DUR) 10 MEQ tablet Take 20 mEq by mouth.       . pramipexole (MIRAPEX) 0.5 MG tablet Take 0.5 mg by mouth 2 (two) times daily.      . QUEtiapine (SEROQUEL) 25 MG tablet Take 25 mg by mouth at bedtime.      . traMADol (ULTRAM) 50 MG tablet Take 50 mg by mouth every 6 (six) hours as needed (Pain).      Marland Kitchen warfarin (COUMADIN) 5 MG tablet Take 5 mg by mouth every evening.        No current facility-administered medications for this visit.       Allergies  Allergen Reactions  . Gabapentin     Causes CHF  . Wellbutrin [Bupropion] Nausea Only  :  Family History  Problem Relation Age of Onset  . Lung cancer Father     mets  . Heart  disease Mother   . Heart disease Sister   . Liver cancer Father   :  History   Social History  . Marital Status: Widowed    Spouse Name: N/A    Number of Children: 89  . Years of Education: N/A   Occupational History  . Retired    Social History Main Topics  . Smoking status: Never Smoker   . Smokeless tobacco: Never Used  . Alcohol Use: No  . Drug Use: No  . Sexual Activity: Not Currently   Other Topics Concern  . Not on file   Social History Narrative  . No narrative on file  :  Pertinent items are noted in HPI.  Exam: Blood pressure 138/47, pulse 69, temperature 98.4 F (36.9 C), temperature source Oral, resp. rate 18, height 5\' 1"  (1.549 m), weight 206 lb (93.441 kg), SpO2 95.00%. General appearance: alert and cooperative Head: Normocephalic, without obvious abnormality Throat: lips, mucosa, and tongue normal; teeth and gums normal Neck: no adenopathy Back: symmetric, no curvature. ROM normal. No CVA tenderness. Resp: clear to auscultation bilaterally Chest wall: no tenderness Cardio: regular rate and rhythm, S1, S2 normal, no murmur, click, rub or gallop GI: soft, non-tender; bowel sounds normal; no masses,  no organomegaly Pelvic: cervix normal in appearance, external genitalia normal, no adnexal masses or tenderness, no cervical motion tenderness, rectovaginal septum normal, uterus normal size, shape, and consistency and vagina normal without discharge Extremities: Trace edema noted on the left more than the right. Skin: Erythema noted on the right side of her abdominal wall. Lymph nodes: Cervical, supraclavicular, and axillary nodes normal. Neurologic: Grossly normal  CBC    Component Value Date/Time   WBC 7.3 03/16/2014 1140   RBC 4.16 03/16/2014 1140   HGB 11.9* 03/16/2014 1140   HCT 38.5 03/16/2014 1140   PLT 230 03/16/2014 1140   MCV 92.5 03/16/2014 1140   MCH 28.6 03/16/2014 1140   MCHC 30.9 03/16/2014 1140   RDW 14.0 03/16/2014 1140      Chemistry       Component Value Date/Time   NA 141 03/16/2014 1140   K 4.1 03/16/2014 1140   CL 102 03/16/2014 1140   CO2 29 03/16/2014 1140   BUN 13 03/16/2014 1140   CREATININE 1.34* 03/16/2014 1140  Component Value Date/Time   CALCIUM 9.4 03/16/2014 1140       US Transvaginal Non-ob  03/09/2014   CLINICAL DATA:  Evaluate for pelvic mass.  EXAM: TRANSVAGINAL ULTRASOUND OF PELVIS; US PELVIS COMPLETE  TECHNIQUE: Transvaginal ultrasound examination of the pelvis was performed including evaluation of the uterus, ovaries, adnexal regions, and pelvic cul-de-sac.  COMPARISON:  CT 01/01/2014 as well as PET-CT 02/17/2014  FINDINGS: Uterus  Measurements: 3.6 x 3.9 x 7.6 cm. There is a central fluid collection almost completely replacing the uterus likely originating from the endometrium measuring 2.7 x 3.3 x 4.9 cm. There is increased heterogeneous echogenicity along the inferior/ dependent portion of this fluid collection which may represent a solid component, although there is no internal vascularity over this echogenic portion. This region was not hypermetabolic on the recent PET-CT.  Endometrium  Not visualized.  Right ovary  Not visualized.  Left ovary  Not visualized.  Other findings: Small amount of free pelvic fluid. Bladder is unremarkable.  IMPRESSION: Complex fluid collection centered over the endometrium measuring 2.7 x 3.3 x 4.9 cm. This may be secondary to cervical stenosis from previous procedures, however malignancy remains the diagnosis of exclusion. Recommend GYN consultation for possible hysteroscopy/ tissue sampling.  Small amount of free pelvic fluid.   Electronically Signed   By: Marin Olp M.D.   On: 03/09/2014 09:47   US Pelvis Complete  03/09/2014   CLINICAL DATA:  Evaluate for pelvic mass.  EXAM: TRANSVAGINAL ULTRASOUND OF PELVIS; US PELVIS COMPLETE  TECHNIQUE: Transvaginal ultrasound examination of the pelvis was performed including evaluation of the uterus, ovaries, adnexal regions, and pelvic  cul-de-sac.  COMPARISON:  CT 01/01/2014 as well as PET-CT 02/17/2014  FINDINGS: Uterus  Measurements: 3.6 x 3.9 x 7.6 cm. There is a central fluid collection almost completely replacing the uterus likely originating from the endometrium measuring 2.7 x 3.3 x 4.9 cm. There is increased heterogeneous echogenicity along the inferior/ dependent portion of this fluid collection which may represent a solid component, although there is no internal vascularity over this echogenic portion. This region was not hypermetabolic on the recent PET-CT.  Endometrium  Not visualized.  Right ovary  Not visualized.  Left ovary  Not visualized.  Other findings: Small amount of free pelvic fluid. Bladder is unremarkable.  IMPRESSION: Complex fluid collection centered over the endometrium measuring 2.7 x 3.3 x 4.9 cm. This may be secondary to cervical stenosis from previous procedures, however malignancy remains the diagnosis of exclusion. Recommend GYN consultation for possible hysteroscopy/ tissue sampling.  Small amount of free pelvic fluid.   Electronically Signed   By: Marin Olp M.D.   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 She presented with left hydronephrosis and an ill-defined mass surrounding the renal pelvis extending into the left ureter. Percutaneous biopsies has been indeterminant the most recent brushings immunohistochemical stains suggested locally advanced urothelial carcinoma. Her case has been discussed extensively at the genitourinary multidisciplinary tumor conference. Imaging studies including PET CT scan, CT scans and ultrasounds were reviewed personally with the radiologist. Her pathology results were reviewed with pathology as well. Her case was also discussed with Dr. Alinda Money among others.  The natural course of this disease was discussed today with the patient and her 2 daughters. The ideal way to treat her  cancer would be neoadjuvant cis-platinum based chemotherapy followed by surgical resection. Her surgery  would be rather extensive and will probably require a pelvic exoneration. She is certainly not a candidate for that. Given her age, comorbid conditions the extent of the tumor cure is really not a realistic option.  The goal of the treatment would be at this point palliative. Whether that would be radiation therapy or chemotherapy the goal would be to reduce symptoms and extend life. Cis-platinum based chemotherapy at this point would probably cause more harm than actual benefit. However, should she develop metastatic disease than platinum-based chemotherapy can be used to palliate her systemic disease. If she develops local symptoms such as continuous abdominal pain, abdominal fullness or increase in her lower extremity edema, palliative radiation therapy can be used for local control or systemic chemotherapy can be used as well.  I also discussed the prognosis associated with these tumors. Although she does not have metastatic disease at diagnosis from these tumors I usually poor. She is at risk of developing locally advanced disease and definitely systemic disease. I shared these findings with her daughter and I feel without treatment, she has a limited life expectancy.  I urged her to followup with Dr. Bobby Rumpf whom she had seen before for continuous observation and management and intervention with chemotherapy or radiation for palliative purposes.  I also urged her to contact her primary care physician regarding potential cellulitis of the right abdominal wall and possible need for antibiotics.  All her questions were answered and I will be happy to see her in the future as needed.

## 2014-04-02 NOTE — Progress Notes (Signed)
Checked in new pt with no financial concerns. °

## 2014-04-14 ENCOUNTER — Inpatient Hospital Stay (HOSPITAL_COMMUNITY)
Admission: AD | Admit: 2014-04-14 | Discharge: 2014-04-17 | DRG: 687 | Disposition: A | Discharge status: Hospice | Payer: Medicare Other | Source: Other Acute Inpatient Hospital | Attending: Internal Medicine | Admitting: Internal Medicine

## 2014-04-14 ENCOUNTER — Other Ambulatory Visit: Payer: Self-pay | Admitting: Internal Medicine

## 2014-04-14 ENCOUNTER — Inpatient Hospital Stay (HOSPITAL_COMMUNITY): Payer: Medicare Other

## 2014-04-14 ENCOUNTER — Encounter (HOSPITAL_COMMUNITY): Payer: Self-pay

## 2014-04-14 DIAGNOSIS — R109 Unspecified abdominal pain: Secondary | ICD-10-CM | POA: Diagnosis present

## 2014-04-14 DIAGNOSIS — I4891 Unspecified atrial fibrillation: Secondary | ICD-10-CM | POA: Diagnosis present

## 2014-04-14 DIAGNOSIS — M1990 Unspecified osteoarthritis, unspecified site: Secondary | ICD-10-CM | POA: Diagnosis present

## 2014-04-14 DIAGNOSIS — E669 Obesity, unspecified: Secondary | ICD-10-CM | POA: Diagnosis present

## 2014-04-14 DIAGNOSIS — R180 Malignant ascites: Secondary | ICD-10-CM | POA: Diagnosis present

## 2014-04-14 DIAGNOSIS — IMO0002 Reserved for concepts with insufficient information to code with codable children: Secondary | ICD-10-CM | POA: Diagnosis present

## 2014-04-14 DIAGNOSIS — Z96659 Presence of unspecified artificial knee joint: Secondary | ICD-10-CM

## 2014-04-14 DIAGNOSIS — N1330 Unspecified hydronephrosis: Secondary | ICD-10-CM | POA: Diagnosis present

## 2014-04-14 DIAGNOSIS — C689 Malignant neoplasm of urinary organ, unspecified: Secondary | ICD-10-CM

## 2014-04-14 DIAGNOSIS — K219 Gastro-esophageal reflux disease without esophagitis: Secondary | ICD-10-CM | POA: Diagnosis present

## 2014-04-14 DIAGNOSIS — C669 Malignant neoplasm of unspecified ureter: Secondary | ICD-10-CM | POA: Diagnosis present

## 2014-04-14 DIAGNOSIS — C779 Secondary and unspecified malignant neoplasm of lymph node, unspecified: Secondary | ICD-10-CM | POA: Diagnosis present

## 2014-04-14 DIAGNOSIS — R110 Nausea: Secondary | ICD-10-CM

## 2014-04-14 DIAGNOSIS — C801 Malignant (primary) neoplasm, unspecified: Secondary | ICD-10-CM

## 2014-04-14 DIAGNOSIS — Z6838 Body mass index (BMI) 38.0-38.9, adult: Secondary | ICD-10-CM

## 2014-04-14 DIAGNOSIS — H3530 Unspecified macular degeneration: Secondary | ICD-10-CM | POA: Diagnosis present

## 2014-04-14 DIAGNOSIS — Z954 Presence of other heart-valve replacement: Secondary | ICD-10-CM

## 2014-04-14 DIAGNOSIS — N179 Acute kidney failure, unspecified: Secondary | ICD-10-CM | POA: Diagnosis present

## 2014-04-14 DIAGNOSIS — E440 Moderate protein-calorie malnutrition: Secondary | ICD-10-CM | POA: Diagnosis present

## 2014-04-14 DIAGNOSIS — K5900 Constipation, unspecified: Secondary | ICD-10-CM | POA: Diagnosis present

## 2014-04-14 DIAGNOSIS — Z515 Encounter for palliative care: Secondary | ICD-10-CM

## 2014-04-14 DIAGNOSIS — N135 Crossing vessel and stricture of ureter without hydronephrosis: Secondary | ICD-10-CM | POA: Diagnosis present

## 2014-04-14 DIAGNOSIS — Z95810 Presence of automatic (implantable) cardiac defibrillator: Secondary | ICD-10-CM

## 2014-04-14 DIAGNOSIS — K625 Hemorrhage of anus and rectum: Secondary | ICD-10-CM

## 2014-04-14 DIAGNOSIS — N19 Unspecified kidney failure: Secondary | ICD-10-CM | POA: Diagnosis present

## 2014-04-14 DIAGNOSIS — I509 Heart failure, unspecified: Secondary | ICD-10-CM | POA: Diagnosis present

## 2014-04-14 DIAGNOSIS — Z66 Do not resuscitate: Secondary | ICD-10-CM | POA: Diagnosis not present

## 2014-04-14 DIAGNOSIS — N280 Ischemia and infarction of kidney: Secondary | ICD-10-CM | POA: Diagnosis present

## 2014-04-14 DIAGNOSIS — Z7901 Long term (current) use of anticoagulants: Secondary | ICD-10-CM

## 2014-04-14 DIAGNOSIS — I5022 Chronic systolic (congestive) heart failure: Secondary | ICD-10-CM

## 2014-04-14 DIAGNOSIS — C50919 Malignant neoplasm of unspecified site of unspecified female breast: Secondary | ICD-10-CM | POA: Diagnosis not present

## 2014-04-14 LAB — PROTIME-INR
INR: 6.69 (ref 0.00–1.49)
Prothrombin Time: 58.3 seconds — ABNORMAL HIGH (ref 11.6–15.2)

## 2014-04-14 LAB — COMPREHENSIVE METABOLIC PANEL
ALT: 10 U/L (ref 0–35)
AST: 17 U/L (ref 0–37)
Albumin: 2.6 g/dL — ABNORMAL LOW (ref 3.5–5.2)
Alkaline Phosphatase: 95 U/L (ref 39–117)
Anion gap: 9 (ref 5–15)
BUN: 48 mg/dL — ABNORMAL HIGH (ref 6–23)
CALCIUM: 8 mg/dL — AB (ref 8.4–10.5)
CO2: 38 mEq/L — ABNORMAL HIGH (ref 19–32)
Chloride: 96 mEq/L (ref 96–112)
Creatinine, Ser: 2.85 mg/dL — ABNORMAL HIGH (ref 0.50–1.10)
GFR calc non Af Amer: 14 mL/min — ABNORMAL LOW (ref >90)
GFR, EST AFRICAN AMERICAN: 17 mL/min — AB (ref >90)
GLUCOSE: 91 mg/dL (ref 70–99)
Potassium: 3.7 mEq/L (ref 3.7–5.3)
SODIUM: 143 meq/L (ref 137–147)
TOTAL PROTEIN: 5.7 g/dL — AB (ref 6.0–8.3)
Total Bilirubin: 0.2 mg/dL — ABNORMAL LOW (ref 0.3–1.2)

## 2014-04-14 LAB — PHOSPHORUS: Phosphorus: 3.4 mg/dL (ref 2.3–4.6)

## 2014-04-14 LAB — CBC
HCT: 34.4 % — ABNORMAL LOW (ref 36.0–46.0)
HEMOGLOBIN: 10.8 g/dL — AB (ref 12.0–15.0)
MCH: 28.2 pg (ref 26.0–34.0)
MCHC: 31.4 g/dL (ref 30.0–36.0)
MCV: 89.8 fL (ref 78.0–100.0)
PLATELETS: 233 10*3/uL (ref 150–400)
RBC: 3.83 MIL/uL — ABNORMAL LOW (ref 3.87–5.11)
RDW: 14.2 % (ref 11.5–15.5)
WBC: 11.7 10*3/uL — ABNORMAL HIGH (ref 4.0–10.5)

## 2014-04-14 LAB — APTT: APTT: 103 s — AB (ref 24–37)

## 2014-04-14 LAB — MAGNESIUM: Magnesium: 4.9 mg/dL — ABNORMAL HIGH (ref 1.5–2.5)

## 2014-04-14 MED ORDER — ACETAMINOPHEN 325 MG PO TABS
650.0000 mg | ORAL_TABLET | Freq: Four times a day (QID) | ORAL | Status: DC | PRN
Start: 2014-04-14 — End: 2014-04-17

## 2014-04-14 MED ORDER — MAGNESIUM 250 MG PO TABS
250.0000 mg | ORAL_TABLET | Freq: Two times a day (BID) | ORAL | Status: DC
  Filled 2014-04-14: qty 1

## 2014-04-14 MED ORDER — OXYCODONE HCL 5 MG PO TABS
10.0000 mg | ORAL_TABLET | Freq: Four times a day (QID) | ORAL | Status: DC | PRN
  Administered 2014-04-14 – 2014-04-16 (×3): 10 mg via ORAL
  Filled 2014-04-14 (×4): qty 2

## 2014-04-14 MED ORDER — LORAZEPAM 0.5 MG PO TABS: 0.5000 mg | ORAL_TABLET | Freq: Three times a day (TID) | ORAL | Status: DC | PRN

## 2014-04-14 MED ORDER — AMIODARONE HCL 200 MG PO TABS
200.0000 mg | ORAL_TABLET | Freq: Every morning | ORAL | Status: DC
  Administered 2014-04-14 – 2014-04-17 (×4): 200 mg via ORAL
  Filled 2014-04-14 (×4): qty 1

## 2014-04-14 MED ORDER — ESCITALOPRAM OXALATE 20 MG PO TABS
20.0000 mg | ORAL_TABLET | Freq: Every morning | ORAL | Status: DC
  Administered 2014-04-14 – 2014-04-17 (×4): 20 mg via ORAL
  Filled 2014-04-14 (×4): qty 1

## 2014-04-14 MED ORDER — DOCUSATE SODIUM 100 MG PO CAPS
100.0000 mg | ORAL_CAPSULE | ORAL | Status: DC | PRN
  Filled 2014-04-14: qty 1

## 2014-04-14 MED ORDER — ONDANSETRON HCL 4 MG/2ML IJ SOLN
4.0000 mg | Freq: Four times a day (QID) | INTRAMUSCULAR | Status: DC | PRN
  Administered 2014-04-16 – 2014-04-17 (×2): 4 mg via INTRAVENOUS
  Filled 2014-04-14 (×2): qty 2

## 2014-04-14 MED ORDER — SODIUM CHLORIDE 0.9 % IV SOLN
INTRAVENOUS | Status: DC
  Administered 2014-04-14: 75 mL/h via INTRAVENOUS

## 2014-04-14 MED ORDER — PRAMIPEXOLE DIHYDROCHLORIDE 0.25 MG PO TABS
0.5000 mg | ORAL_TABLET | Freq: Two times a day (BID) | ORAL | Status: DC
  Administered 2014-04-14 – 2014-04-16 (×6): 0.5 mg via ORAL
  Filled 2014-04-14 (×9): qty 2

## 2014-04-14 MED ORDER — SODIUM CHLORIDE 0.9 % IV SOLN
INTRAVENOUS | Status: DC
  Administered 2014-04-15: 07:00:00 via INTRAVENOUS

## 2014-04-14 MED ORDER — PHYTONADIONE 5 MG PO TABS
2.5000 mg | ORAL_TABLET | Freq: Once | ORAL | Status: AC
  Administered 2014-04-14: 2.5 mg via ORAL
  Filled 2014-04-14: qty 1

## 2014-04-14 MED ORDER — SODIUM CHLORIDE 0.9 % IJ SOLN
3.0000 mL | Freq: Two times a day (BID) | INTRAMUSCULAR | Status: DC
  Administered 2014-04-15 – 2014-04-17 (×5): 3 mL via INTRAVENOUS

## 2014-04-14 MED ORDER — QUETIAPINE FUMARATE 50 MG PO TABS
50.0000 mg | ORAL_TABLET | Freq: Every day | ORAL | Status: DC
  Administered 2014-04-14 – 2014-04-16 (×3): 50 mg via ORAL
  Filled 2014-04-14 (×4): qty 1

## 2014-04-14 MED ORDER — OCUVITE PO TABS
1.0000 | ORAL_TABLET | Freq: Two times a day (BID) | ORAL | Status: DC
  Administered 2014-04-14 – 2014-04-17 (×6): 1 via ORAL
  Filled 2014-04-14 (×8): qty 1

## 2014-04-14 MED ORDER — PANTOPRAZOLE SODIUM 40 MG PO TBEC
40.0000 mg | DELAYED_RELEASE_TABLET | Freq: Every day | ORAL | Status: DC
  Administered 2014-04-14 – 2014-04-17 (×4): 40 mg via ORAL
  Filled 2014-04-14 (×6): qty 1

## 2014-04-14 MED ORDER — POLYETHYLENE GLYCOL 3350 17 G PO PACK
17.0000 g | PACK | Freq: Every day | ORAL | Status: DC
  Administered 2014-04-14 – 2014-04-15 (×2): 17 g via ORAL
  Filled 2014-04-14 (×3): qty 1

## 2014-04-14 MED ORDER — MAGNESIUM OXIDE 400 (241.3 MG) MG PO TABS
200.0000 mg | ORAL_TABLET | Freq: Two times a day (BID) | ORAL | Status: DC
  Administered 2014-04-14 – 2014-04-15 (×2): 200 mg via ORAL
  Filled 2014-04-14 (×3): qty 0.5

## 2014-04-14 MED ORDER — FUROSEMIDE 40 MG PO TABS
40.0000 mg | ORAL_TABLET | Freq: Every morning | ORAL | Status: DC
  Administered 2014-04-14 – 2014-04-15 (×2): 40 mg via ORAL
  Filled 2014-04-14 (×2): qty 1

## 2014-04-14 MED ORDER — DOCUSATE SODIUM 100 MG PO CAPS
200.0000 mg | ORAL_CAPSULE | Freq: Every day | ORAL | Status: DC
  Administered 2014-04-14: 200 mg via ORAL
  Filled 2014-04-14 (×2): qty 2

## 2014-04-14 MED ORDER — POLYVINYL ALCOHOL 1.4 % OP SOLN
1.0000 [drp] | Freq: Two times a day (BID) | OPHTHALMIC | Status: DC | PRN
  Filled 2014-04-14: qty 15

## 2014-04-14 NOTE — Progress Notes (Signed)
CRITICAL VALUE ALERT  Critical value received:  INR 6.69   Date of notification:  04/14/2014   Time of notification:  0093  Critical value read back:Yes.    Nurse who received alert:  Horton Chin RN  MD notified (1st page):  Dr. Charlies Silvers  Time of first page:  1725  MD notified (2nd page):  Time of second page:  Responding MD:  Dr. Charlies Silvers  Time MD responded:  N/a MD placed order for Vitamin K

## 2014-04-14 NOTE — Progress Notes (Signed)
PT Cancellation Note  Patient Details Name: Nichole Pena MRN: [id number] DOB: Oct 22, 1929   Cancelled Treatment:    Reason Eval/Treat Not Completed: order received. No H&P available at this time. Will check back on tomorrow to perform eval. Thanks.    Weston Anna, MPT Pager: [phone]

## 2014-04-14 NOTE — Progress Notes (Addendum)
ANTICOAGULATION CONSULT NOTE - Initial Consult  Pharmacy Consult for Warfarin Indication: Afib/ Mechanical MVR  Allergies  Allergen Reactions  . Gabapentin Other (See Comments)    Causes CHF  . Wellbutrin [Bupropion] Nausea Only    Patient Measurements: Height: 5\' 1"  (154.9 cm) Weight: 206 lb (93.44 kg) IBW/kg (Calculated) : 47.8 Heparin Dosing Weight:   Vital Signs: Temp: 97.7 F (36.5 C) (09/02 1309) Temp src: Oral (09/02 1309) BP: 115/42 mmHg (09/02 1309) Pulse Rate: 58 (09/02 1309)  Labs:  Recent Labs  04/14/14 1445  HGB 10.8*  HCT 34.4*  PLT 233  CREATININE 2.85*    Estimated Creatinine Clearance: 16.1 ml/min (by C-G formula based on Cr of 2.85).   Medical History: Past Medical History  Diagnosis Date  . Anemia 2014  . Atrial fibrillation 2004  . Mitral valve prolapse   . DDD (degenerative disc disease)   . DJD (degenerative joint disease)   . Depression   . GERD (gastroesophageal reflux disease)   . Macular degeneration disease   . Cataract   . Hydronephrosis, left   . Pacemaker   . Sleep apnea   . Shortness of breath     shortness of breath- 01/26/14  states x years  . History of blood transfusion   . Leg edema, left 01/25/14    swollen about 3 x size of right leg- toe to hip/ moderate reddness anterior lower leg, non draining- states has been like this.03-16-14 has improved someshat.  . Arthritis   . CHF (congestive heart failure)   . Diverticulosis   . History of oxygen administration     03-16-14 as needed at 3.5 l/m nasally. Not used lately.   Assessment: 75 yoF presents from OSH with ARF and supratherapeutic INR.  MD admission note pending for further details.  Note pt on chronic anticoagulation with warfarin for hx Afib and mechanical MVR, also with hx intermittent rectal bleeding.  Other pertinent PMHx includes recent dx of adenocarcioma, PHTN, CHF, DJD, DDD, anemia, and diverticulosis.   Pharmacy consulted to dose warfarin while inpatient.     Spoke with pt's daughter who states home dose warfarin very hard to manage, usual dose is alternating 5mg  and 7.5mg . Warfarin managed through [facility] in [place]. Daughter brought pt to [facility] 9/1 for AMS and belly pain where INR found to be supratherapeutic, possibly up to 16, for which pt received multiple doses of Vitamin K.    Today, 9/2, INR resulted as 6.69, still supratherapeutic.  Vit K 2.5mg  PO x 1 ordered.  CBC: Hgb low 10.8 / plts WNL SCr elevated 2.85, CrCl ~16 ml/in.    Goal of Therapy:  INR 2.5-3.5 Monitor platelets by anticoagulation protocol: Yes   Plan:  Hold warfarin tonight.  F/u daily PT/INR. F/u with [facility] in AM for more details on home dosing and for records from [place], PharmD, BCPS 04/14/2014, 5:45 PM  Pager: [phone]

## 2014-04-14 NOTE — Significant Event (Signed)
Trying to put admission orders in. Mertie Clause logged in, i am not able to put admission orders. Attempted to put adm orders for past 30 minutes.  Leisa Lenz

## 2014-04-14 NOTE — H&P (Signed)
[facility] History and Physical  Nichole Pena SWN:[id number] DOB: 1929/12/22 DOA: 04/14/2014  Referring physician: ER physician PCP: Ernestene Kiel, MD   Chief Complaint: abdominal pain, consttpation  HPI:  80 year old female with past medical history significant of atrial fibrillation, on anticoagulation with coumadin, rate controlled with amiodarone, constipation, recent left ureteral obstruction and stent placement, had ill-defined mass surrounding the renal pelvis extending into the left ureter. Percutaneous biopsies has been indeterminant the most recent brushings immunohistochemical stains suggested locally advanced urothelial carcinoma, most recent visit with Dr. Alen Blew on oncology who has discussed possible treatment options especially should she develop metastatic disease (at which point she may benefit from palliative radiotherapy to control symptoms and may possibly be ok for platinum based chemotherapy).  She presented to [place] center due to worsening abdominal pain, thought she was more constipated and has received smog enema at [facility]. Her symptoms did not improve. Transfer requested for further management. At this time we will repeat all blood work, try to upload all imaging information into EPIC so we would not have duplicate studies. She had CT abdomen w/o contrast in [place]. We will obtain abd x ray for evaluation of possible obstruction.   Assessment & Plan    Active Problems: Abdominal pain, constipation  Obtain abdominal x ray to rule out obstruction.  Will upload imaging results info to EPIC since she had CT abd without contrast in [place]  Will start low rate IV fluids due to LE swelling.  Restart her home meds.  Order also placed for abdominal US for evaluation of ascites   Admission blood work ordered. Atrial fibrillation   On anticoagulation with coumadin  Coumadin dosing per pharmacy.  Restart amiodarone  Telemetry monitoring   Advanced urothelial carcinoma  Will let Dr. Alinda Money of patient's admission Lower extremity swelling  Restart home dose lasix Acute renal failure  Possibly due to urothelial carcinoma, lasix  She has LE swelling so needs lasix  Will get nephrology consult if renal function does not imporve   DVT prophylaxis  On therapeutic anticoagulation; coumadin on hold due to supratherapeutic INR  Radiological Exams on Admission: No results found.   Code Status: Full Family Communication: Plan of care discussed with the patient and her daughter Disposition Plan: Admit for further evaluation  Leisa Lenz, MD  [facility] Pager [phone]  Review of Systems:  Constitutional: Negative for fever, chills and positive for malaise/fatigue. Negative for diaphoresis.  HENT: Negative for hearing loss, ear pain, nosebleeds, congestion, sore throat, neck pain, tinnitus and ear discharge.   Eyes: Negative for blurred vision, double vision, photophobia, pain, discharge and redness.  Respiratory: Negative for cough, hemoptysis, sputum production, shortness of breath, wheezing and stridor.   Cardiovascular: Negative for chest pain, palpitations, orthopnea, claudication and leg swelling.  Gastrointestinal: positive for abdominal pain, constipation, no nausea Genitourinary: Negative for dysuria, urgency, frequency, hematuria and flank pain.  Musculoskeletal: Negative for myalgias, positive for back pain,negative for joint pain and falls.  Skin: Negative for itching and rash.  Neurological: Negative for dizziness and positive for weakness. Negative for tingling, tremors, sensory change, speech change, focal weakness, loss of consciousness and headaches.  Endo/Heme/Allergies: Negative for environmental allergies and polydipsia. Does not bruise/bleed easily.  Psychiatric/Behavioral: Negative for suicidal ideas. The patient is not nervous/anxious.      Past Medical History  Diagnosis Date  . Anemia  2014  . Atrial fibrillation 2004  . Mitral valve prolapse   . DDD (degenerative disc disease)   . DJD (  degenerative joint disease)   . Depression   . GERD (gastroesophageal reflux disease)   . Macular degeneration disease   . Cataract   . Hydronephrosis, left   . Pacemaker   . Sleep apnea   . Shortness of breath     shortness of breath- 01/26/14  states x years  . History of blood transfusion   . Leg edema, left 01/25/14    swollen about 3 x size of right leg- toe to hip/ moderate reddness anterior lower leg, non draining- states has been like this.03-16-14 has improved someshat.  . Arthritis   . CHF (congestive heart failure)   . Diverticulosis   . History of oxygen administration     03-16-14 as needed at 3.5 l/m nasally. Not used lately.   Past Surgical History  Procedure Laterality Date  . Pacemaker insertion  2008    Mitral heart replacement  . Knee arthroscopy Right 2008  . Cataract extraction w/ intraocular lens implant Bilateral   . Cardiac valve replacement  2011    mechanical  . Tonsillectomy    . Joint replacement Right 2008    partial knee  . Cystoscopy with retrograde pyelogram, ureteroscopy and stent placement Left 02/02/2014    Procedure: CYSTOSCOPY WITH LEFT  RETROGRADE AND LEFT  STENT PLACEMENT;  Surgeon: Malka So, MD;  Location: [facility];  Service: Urology;  Laterality: Left;  . Dilation and curettage of uterus  03/23/14  . Cystoscopy w/ ureteral stent placement  03/23/14  . Examination under anesthesia N/A 03/23/2014    Procedure: EXAM UNDER ANESTHESIA;  Surgeon: Everitt Amber, MD;  Location: [facility];  Service: Gynecology;  Laterality: N/A;  . Dilation and curettage of uterus N/A 03/23/2014    Procedure: DILATATION AND CURETTAGE/ENDOCERVICAL BIOPSY ;  Surgeon: Everitt Amber, MD;  Location: [facility];  Service: Gynecology;  Laterality: N/A;  . Cystoscopy with ureteroscopy Left 03/23/2014    Procedure: CYSTOSCOPY WITH LEFT URETEROSCOPY LEFT URETERAL STENT CHANGE, LEFT RENAL  PELVIS WASHINGS,BLADDER WASHINGS AND LEFT URETERAL BRUSH BIOPSY;  Surgeon: Raynelle Bring, MD;  Location: [facility];  Service: Urology;  Laterality: Left;   Social History:  reports that she has never smoked. She has never used smokeless tobacco. She reports that she does not drink alcohol or use illicit drugs.  Allergies  Allergen Reactions  . Gabapentin Other (See Comments)    Causes CHF  . Wellbutrin [Bupropion] Nausea Only    Family History:  Family History  Problem Relation Age of Onset  . Lung cancer Father     mets  . Heart disease Mother   . Heart disease Sister   . Liver cancer Father      Prior to Admission medications   Medication Sig Start Date End Date Taking? Authorizing Provider  acetaminophen (TYLENOL) 650 MG CR tablet Take 1,300 mg by mouth every 8 (eight) hours as needed for pain.    Historical Provider, MD  amiodarone (PACERONE) 200 MG tablet Take 200 mg by mouth every morning.     Historical Provider, MD  beta carotene w/minerals (OCUVITE) tablet Take 1 tablet by mouth 2 (two) times daily.     Historical Provider, MD  docusate sodium (COLACE) 100 MG capsule Take 100 mg by mouth 2 (two) times daily.    Historical Provider, MD  escitalopram (LEXAPRO) 20 MG tablet Take 20 mg by mouth every morning.    Historical Provider, MD  esomeprazole (NEXIUM) 40 MG capsule Take 40 mg by mouth daily with breakfast.  Historical Provider, MD  furosemide (LASIX) 40 MG tablet Take 40 mg by mouth every morning.     Historical Provider, MD  LORazepam (ATIVAN) 0.5 MG tablet Take 0.5 mg by mouth every 8 (eight) hours as needed for anxiety.    Historical Provider, MD  Magnesium 250 MG TABS Take 250 mg by mouth 2 (two) times daily.    Historical Provider, MD  polyethylene glycol (MIRALAX / GLYCOLAX) packet Take 17 g by mouth at bedtime.    Historical Provider, MD  polyvinyl alcohol (LIQUIFILM TEARS) 1.4 % ophthalmic solution Place 1 drop into both eyes 2 (two) times daily as needed for dry  eyes.     Historical Provider, MD  potassium chloride (K-DUR) 10 MEQ tablet Take 20 mEq by mouth.     Historical Provider, MD  pramipexole (MIRAPEX) 0.5 MG tablet Take 0.5 mg by mouth 2 (two) times daily.    Historical Provider, MD  warfarin (COUMADIN) 5 MG tablet Take 5 mg by mouth every evening.     Historical Provider, MD   Physical Exam: Filed Vitals:   04/14/14 1309  BP: 115/42  Pulse: 58  Temp: 97.7 F (36.5 C)  TempSrc: Oral  Resp: 18  Height: 5\' 1"  (1.549 m)  Weight: 93.44 kg (206 lb)  SpO2: 96%    Physical Exam  Constitutional: Appears well-developed and well-nourished. No distress.  HENT: Normocephalic. No tonsillar erythema or exudates Eyes: Conjunctivae and EOM are normal. PERRLA, no scleral icterus.  Neck: Normal ROM. Neck supple. No JVD. No tracheal deviation. No thyromegaly.  CVS: irregular rhythm, S1/S2 appreciated  Pulmonary: Effort and breath sounds normal, no stridor, rhonchi, wheezes, rales.  Abdominal: distended and tenderness to palpation in mid abdomen,no  rebound or guarding.  Musculoskeletal: Normal range of motion. +1 LE pitting edema but no tenderness.  Lymphadenopathy: No lymphadenopathy noted, cervical, inguinal. Neuro: Alert. Normal reflexes, muscle tone coordination. No focal neurologic deficits. Skin: Skin is warm and dry. No rash noted.  Psychiatric: Normal mood and affect. Behavior, judgment, thought content normal.   Labs on Admission:  Basic Metabolic Panel: No results found for this basename: NA, K, CL, CO2, GLUCOSE, BUN, CREATININE, CALCIUM, MG, PHOS,  in the last 168 hours Liver Function Tests: No results found for this basename: AST, ALT, ALKPHOS, BILITOT, PROT, ALBUMIN,  in the last 168 hours No results found for this basename: LIPASE, AMYLASE,  in the last 168 hours No results found for this basename: AMMONIA,  in the last 168 hours CBC: No results found for this basename: WBC, NEUTROABS, HGB, HCT, MCV, PLT,  in the last 168  hours Cardiac Enzymes: No results found for this basename: CKTOTAL, CKMB, CKMBINDEX, TROPONINI,  in the last 168 hours BNP: No components found with this basename: POCBNP,  CBG: No results found for this basename: GLUCAP,  in the last 168 hours  If 7PM-7AM, please contact night-coverage [email or web address] Password [email or web address] 04/14/2014, 1:52 PM

## 2014-04-15 ENCOUNTER — Inpatient Hospital Stay (HOSPITAL_COMMUNITY): Payer: Medicare Other

## 2014-04-15 DIAGNOSIS — C801 Malignant (primary) neoplasm, unspecified: Secondary | ICD-10-CM

## 2014-04-15 DIAGNOSIS — N2889 Other specified disorders of kidney and ureter: Secondary | ICD-10-CM

## 2014-04-15 DIAGNOSIS — N19 Unspecified kidney failure: Secondary | ICD-10-CM

## 2014-04-15 DIAGNOSIS — C669 Malignant neoplasm of unspecified ureter: Principal | ICD-10-CM

## 2014-04-15 DIAGNOSIS — E440 Moderate protein-calorie malnutrition: Secondary | ICD-10-CM | POA: Insufficient documentation

## 2014-04-15 DIAGNOSIS — N1330 Unspecified hydronephrosis: Secondary | ICD-10-CM

## 2014-04-15 DIAGNOSIS — I509 Heart failure, unspecified: Secondary | ICD-10-CM

## 2014-04-15 DIAGNOSIS — I5022 Chronic systolic (congestive) heart failure: Secondary | ICD-10-CM

## 2014-04-15 DIAGNOSIS — R188 Other ascites: Secondary | ICD-10-CM

## 2014-04-15 LAB — COMPREHENSIVE METABOLIC PANEL
ALT: 10 U/L (ref 0–35)
AST: 21 U/L (ref 0–37)
Albumin: 2.5 g/dL — ABNORMAL LOW (ref 3.5–5.2)
Alkaline Phosphatase: 94 U/L (ref 39–117)
Anion gap: 10 (ref 5–15)
BUN: 48 mg/dL — ABNORMAL HIGH (ref 6–23)
CALCIUM: 7.8 mg/dL — AB (ref 8.4–10.5)
CO2: 34 mEq/L — ABNORMAL HIGH (ref 19–32)
CREATININE: 2.99 mg/dL — AB (ref 0.50–1.10)
Chloride: 94 mEq/L — ABNORMAL LOW (ref 96–112)
GFR calc Af Amer: 16 mL/min — ABNORMAL LOW (ref >90)
GFR, EST NON AFRICAN AMERICAN: 14 mL/min — AB (ref >90)
Glucose, Bld: 99 mg/dL (ref 70–99)
Potassium: 3.7 mEq/L (ref 3.7–5.3)
Sodium: 138 mEq/L (ref 137–147)
TOTAL PROTEIN: 5.4 g/dL — AB (ref 6.0–8.3)
Total Bilirubin: 0.3 mg/dL (ref 0.3–1.2)

## 2014-04-15 LAB — CBC
HCT: 33.1 % — ABNORMAL LOW (ref 36.0–46.0)
Hemoglobin: 10.3 g/dL — ABNORMAL LOW (ref 12.0–15.0)
MCH: 27.8 pg (ref 26.0–34.0)
MCHC: 31.1 g/dL (ref 30.0–36.0)
MCV: 89.5 fL (ref 78.0–100.0)
Platelets: 248 10*3/uL (ref 150–400)
RBC: 3.7 MIL/uL — ABNORMAL LOW (ref 3.87–5.11)
RDW: 14.2 % (ref 11.5–15.5)
WBC: 11.3 10*3/uL — ABNORMAL HIGH (ref 4.0–10.5)

## 2014-04-15 LAB — BODY FLUID CELL COUNT WITH DIFFERENTIAL
Eos, Fluid: 1 %
Lymphs, Fluid: 10 %
MONOCYTE-MACROPHAGE-SEROUS FLUID: 42 % — AB (ref 50–90)
Neutrophil Count, Fluid: 47 % — ABNORMAL HIGH (ref 0–25)
Total Nucleated Cell Count, Fluid: 826 cu mm (ref 0–1000)

## 2014-04-15 LAB — PROTIME-INR
INR: 3.32 — ABNORMAL HIGH (ref 0.00–1.49)
Prothrombin Time: 33.7 seconds — ABNORMAL HIGH (ref 11.6–15.2)

## 2014-04-15 LAB — ALBUMIN, FLUID (OTHER): ALBUMIN FL: 1.9 g/dL

## 2014-04-15 LAB — GLUCOSE, CAPILLARY: Glucose-Capillary: 88 mg/dL (ref 70–99)

## 2014-04-15 MED ORDER — WARFARIN - PHARMACIST DOSING INPATIENT
Freq: Every day | Status: DC
Start: 2014-04-15 — End: 2014-04-17

## 2014-04-15 MED ORDER — BISACODYL 10 MG RE SUPP
10.0000 mg | Freq: Once | RECTAL | Status: AC
  Administered 2014-04-15: 10 mg via RECTAL
  Filled 2014-04-15: qty 1

## 2014-04-15 MED ORDER — WARFARIN 0.5 MG HALF TABLET
0.5000 mg | ORAL_TABLET | Freq: Once | ORAL | Status: AC
  Administered 2014-04-15: 0.5 mg via ORAL
  Filled 2014-04-15: qty 1

## 2014-04-15 MED ORDER — NYSTATIN 100000 UNIT/ML MT SUSP
5.0000 mL | Freq: Four times a day (QID) | OROMUCOSAL | Status: DC
  Administered 2014-04-15 – 2014-04-17 (×9): 500000 [IU] via ORAL
  Filled 2014-04-15 (×12): qty 5

## 2014-04-15 NOTE — Progress Notes (Signed)
Received call from Dr. Charlies Silvers. Dr. Charlies Silvers requested that the abdominal CT results from [facility] be scanned into Epic. There is no way to scan records from [facility] into [place]. This RN tried to page Dr. Charlies Silvers back to make her aware of this, but no page returned. Results from the abdominal CT from [facility]  is located in the patient's chart.

## 2014-04-15 NOTE — Progress Notes (Signed)
[facility] PROGRESS NOTE   Nichole Pena VOZ:[id number] DOB: 1929-10-21 DOA: 04/14/2014 PCP: Ernestene Kiel, MD  HPI/Subjective: Seen with his daughter at bedside, patient has some abdominal discomfort, some SOB. Concern about not having bowel movement.  Assessment/Plan: Principal Problem:   Abdominal pain, unspecified site Active Problems:   Ureteral obstruction, left   Renal failure   Acute renal arterial thrombosis   Unspecified constipation     Abdominal pain, constipation   Abdominal x-ray did not show any obstruction.  CT scan from [facility] available in the PACS system  Discontinue IV fluids.  Restart her home meds.   Order also placed for abdominal US for evaluation of ascites   Admission blood work ordered.  Ascites  Patient has significant ascites, confirmed by CT scan, this is probably contributing to the SOB.  Unlikely patient has any type of PE as patient is on Coumadin.  We'll obtain paracentesis, discussed with radiology there are okay with INR 3.3.  Status post mitral valve replacement  Patient is on Coumadin for anticoagulation, yesterday INR was 6.69, today is 3.32.  This is at the upper end of therapeutic range.  Per daughter INR was recently >10.  Atrial fibrillation   On anticoagulation with coumadin   Coumadin dosing per pharmacy.   Restart amiodarone   Telemetry monitoring   Advanced urothelial carcinoma  Will let Dr. Alinda Money of patient's admission  Lower extremity swelling  Restart home dose lasix  Acute renal failure  Possibly due to urothelial carcinoma, I will hold Lasix for today, hopefully she will get paracentesis. Patient has mild right-sided hydronephrosis, Dr. Alinda Money to evaluate.   Code Status: Full code Family Communication: Plan discussed with the patient. Disposition Plan: Remains inpatient   Consultants: Oncology  . Urology.  Procedures:  None  Antibiotics:  None   Objective: Filed Vitals:   04/15/14 0616  BP: 123/63  Pulse: 67  Temp: 97.5 F (36.4 C)  Resp: 18    Intake/Output Summary (Last 24 hours) at 04/15/14 1036 Last data filed at 04/15/14 0700  Gross per 24 hour  Intake   1160 ml  Output    375 ml  Net    785 ml   Filed Weights   04/14/14 1309  Weight: 93.44 kg (206 lb)    Exam: General: Alert and awake, oriented x3, not in any acute distress. HEENT: anicteric sclera, pupils reactive to light and accommodation, EOMI CVS: S1-S2 clear, no murmur rubs or gallops Chest: clear to auscultation bilaterally, no wheezing, rales or rhonchi Abdomen: soft nontender, nondistended, normal bowel sounds, no organomegaly Extremities: no cyanosis, clubbing or edema noted bilaterally Neuro: Cranial nerves II-XII intact, no focal neurological deficits  Data Reviewed: Basic Metabolic Panel:  Recent Labs Lab 04/14/14 1445 04/15/14 0435  NA 143 138  K 3.7 3.7  CL 96 94*  CO2 38* 34*  GLUCOSE 91 99  BUN 48* 48*  CREATININE 2.85* 2.99*  CALCIUM 8.0* 7.8*  MG 4.9*  --   PHOS 3.4  --    Liver Function Tests:  Recent Labs Lab 04/14/14 1445 04/15/14 0435  AST 17 21  ALT 10 10  ALKPHOS 95 94  BILITOT 0.2* 0.3  PROT 5.7* 5.4*  ALBUMIN 2.6* 2.5*   No results found for this basename: LIPASE, AMYLASE,  in the last 168 hours No results found for this basename: AMMONIA,  in the last 168 hours CBC:  Recent Labs Lab 04/14/14 1445 04/15/14 0435  WBC 11.7* 11.3*  HGB 10.8*  10.3*  HCT 34.4* 33.1*  MCV 89.8 89.5  PLT 233 248   Cardiac Enzymes: No results found for this basename: CKTOTAL, CKMB, CKMBINDEX, TROPONINI,  in the last 168 hours BNP (last 3 results) No results found for this basename: PROBNP,  in the last 8760 hours CBG: No results found for this basename: GLUCAP,  in the last 168 hours  Micro No results found for this or any previous visit (from the past  240 hour(s)).   Studies: Dg Abd Portable 1v  04/14/2014   CLINICAL DATA:  Pain.  EXAM: PORTABLE ABDOMEN - 1 VIEW  COMPARISON:  CT 04/13/2014.  KUB 8/26 /2015.  FINDINGS: Soft tissue structures are unremarkable. Mild colonic distention noted. No evidence of progressive bowel distention or free air. Double-J ureteral stent noted on the left. Basilar atelectasis. Cardiac pacing leads. Cardiac valve replacement. Changes of bilateral sacroiliitis noted.  IMPRESSION: 1. Mild colonic distention noted. No evidence of progressive colonic distention or free air. 2. Double-J left ureteral stent in good anatomic position. 3. Bilateral sacroiliitis. 4. Bibasilar atelectasis. 5. Cardiac pacer and cardiac valve replacement.   Electronically Signed   By: Marcello Moores  Register   On: 04/14/2014 21:17    Scheduled Meds: . amiodarone  200 mg Oral q morning - 10a  . beta carotene w/minerals  1 tablet Oral BID  . docusate sodium  200 mg Oral QHS  . escitalopram  20 mg Oral q morning - 10a  . furosemide  40 mg Oral q morning - 10a  . magnesium oxide  200 mg Oral BID  . pantoprazole  40 mg Oral Daily  . polyethylene glycol  17 g Oral QHS  . pramipexole  0.5 mg Oral BID  . QUEtiapine  50 mg Oral QHS  . sodium chloride  3 mL Intravenous Q12H   Continuous Infusions: . sodium chloride 50 mL/hr at 04/15/14 0630       Time spent: 35 minutes    [facility]  [facility] Pager [phone] If 7PM-7AM, please contact night-coverage at [email or web address], password [facility] 04/15/2014, 10:36 AM  LOS: 1 day

## 2014-04-15 NOTE — Consult Note (Signed)
Urology Consult   Physician requesting consult: Dr. Hartford Poli  Reason for consult: Right hydronephrosis and AKI  History of Present Illness: Nichole Pena is a 80 y.o. with locally advanced urothelial carcinoma of the left upper urinary tract.  She has an indwelling ureteral stent for left ureteral obstruction and her baseline Cr is 1.8.  She has been evaluated by Dr. Alen Blew and Dr. Bobby Rumpf in Oncology and she has elected to proceed with straight palliative care rather than systemic treatment for her malignancy considering her advanced age and poor prognosis.  She presented to [facility] a couple of days ago with complaints of generalized decline of function, weakness, and abdominal pain worse on the right compared to the left.  An unenhanced CT scan of the abdomen demonstrated abdominal ascites and new onset right hydronephrosis along with an elevated Cr from baseline up to 2.8. She was transferred to [facility] at her request.   She underwent paracentesis today with 2.8 L of fluid removed. Her pain has been improved but is still present particularly in her right upper quadrant.  Past Medical History  Diagnosis Date  . Anemia 2014  . Atrial fibrillation 2004  . Mitral valve prolapse   . DDD (degenerative disc disease)   . DJD (degenerative joint disease)   . Depression   . GERD (gastroesophageal reflux disease)   . Macular degeneration disease   . Cataract   . Hydronephrosis, left   . Pacemaker   . Sleep apnea   . Shortness of breath     shortness of breath- 01/26/14  states x years  . History of blood transfusion   . Leg edema, left 01/25/14    swollen about 3 x size of right leg- toe to hip/ moderate reddness anterior lower leg, non draining- states has been like this.03-16-14 has improved someshat.  . Arthritis   . CHF (congestive heart failure)   . Diverticulosis   . History of oxygen administration     03-16-14 as needed at 3.5 l/m nasally. Not used lately.    Past Surgical  History  Procedure Laterality Date  . Pacemaker insertion  2008    Mitral heart replacement  . Knee arthroscopy Right 2008  . Cataract extraction w/ intraocular lens implant Bilateral   . Cardiac valve replacement  2011    mechanical  . Tonsillectomy    . Joint replacement Right 2008    partial knee  . Cystoscopy with retrograde pyelogram, ureteroscopy and stent placement Left 02/02/2014    Procedure: CYSTOSCOPY WITH LEFT  RETROGRADE AND LEFT  STENT PLACEMENT;  Surgeon: Malka So, MD;  Location: [facility];  Service: Urology;  Laterality: Left;  . Dilation and curettage of uterus  03/23/14  . Cystoscopy w/ ureteral stent placement  03/23/14  . Examination under anesthesia N/A 03/23/2014    Procedure: EXAM UNDER ANESTHESIA;  Surgeon: Everitt Amber, MD;  Location: [facility];  Service: Gynecology;  Laterality: N/A;  . Dilation and curettage of uterus N/A 03/23/2014    Procedure: DILATATION AND CURETTAGE/ENDOCERVICAL BIOPSY ;  Surgeon: Everitt Amber, MD;  Location: [facility];  Service: Gynecology;  Laterality: N/A;  . Cystoscopy with ureteroscopy Left 03/23/2014    Procedure: CYSTOSCOPY WITH LEFT URETEROSCOPY LEFT URETERAL STENT CHANGE, LEFT RENAL PELVIS WASHINGS,BLADDER WASHINGS AND LEFT URETERAL BRUSH BIOPSY;  Surgeon: Raynelle Bring, MD;  Location: [facility];  Service: Urology;  Laterality: Left;     Current Hospital Medications:  Home meds:    Medication List  ASK your doctor about these medications       acetaminophen 650 MG CR tablet  Commonly known as:  TYLENOL  Take 1,300 mg by mouth every 8 (eight) hours as needed for pain.     amiodarone 200 MG tablet  Commonly known as:  PACERONE  Take 200 mg by mouth every morning.     beta carotene w/minerals tablet  Take 1 tablet by mouth 2 (two) times daily.     docusate sodium 100 MG capsule  Commonly known as:  COLACE  Take 100-200 mg by mouth See admin instructions. Takes 100mg  with each Oxycodone 10mg  taken, then 200mg  at bedtime.      escitalopram 20 MG tablet  Commonly known as:  LEXAPRO  Take 20 mg by mouth every morning.     esomeprazole 40 MG capsule  Commonly known as:  NEXIUM  Take 40 mg by mouth daily with breakfast.     furosemide 40 MG tablet  Commonly known as:  LASIX  Take 40 mg by mouth every morning.     LORazepam 0.5 MG tablet  Commonly known as:  ATIVAN  Take 0.5 mg by mouth every 8 (eight) hours as needed for anxiety.     Magnesium 250 MG Tabs  Take 250 mg by mouth 2 (two) times daily.     Oxycodone HCl 10 MG Tabs  Take 10 mg by mouth every 6 (six) hours as needed (for pain).     polyethylene glycol packet  Commonly known as:  MIRALAX / GLYCOLAX  Take 17 g by mouth at bedtime.     polyvinyl alcohol 1.4 % ophthalmic solution  Commonly known as:  LIQUIFILM TEARS  Place 1 drop into both eyes 2 (two) times daily as needed for dry eyes.     potassium chloride SA 20 MEQ tablet  Commonly known as:  K-DUR,KLOR-CON  Take 20 mEq by mouth daily.     pramipexole 0.5 MG tablet  Commonly known as:  MIRAPEX  Take 0.5 mg by mouth 2 (two) times daily. Takes at 1700 and bedtime     QUEtiapine 50 MG tablet  Commonly known as:  SEROQUEL  Take 50 mg by mouth at bedtime.     warfarin 5 MG tablet  Commonly known as:  COUMADIN  Take 5 mg by mouth every evening.        Scheduled Meds: . amiodarone  200 mg Oral q morning - 10a  . beta carotene w/minerals  1 tablet Oral BID  . escitalopram  20 mg Oral q morning - 10a  . nystatin  5 mL Oral QID  . pantoprazole  40 mg Oral Daily  . polyethylene glycol  17 g Oral QHS  . pramipexole  0.5 mg Oral BID  . QUEtiapine  50 mg Oral QHS  . sodium chloride  3 mL Intravenous Q12H  . Warfarin - Pharmacist Dosing Inpatient   Does not apply q1800   Continuous Infusions:  PRN Meds:.acetaminophen, LORazepam, ondansetron (ZOFRAN) IV, oxyCODONE, polyvinyl alcohol  Allergies:  Allergies  Allergen Reactions  . Gabapentin Other (See Comments)    Causes CHF  .  Wellbutrin [Bupropion] Nausea Only    Family History  Problem Relation Age of Onset  . Lung cancer Father     mets  . Heart disease Mother   . Heart disease Sister   . Liver cancer Father     Social History:  reports that she has never smoked. She has never used smokeless tobacco.  She reports that she does not drink alcohol or use illicit drugs.  ROS: A complete review of systems was performed.  All systems are negative except for pertinent findings as noted.  Physical Exam:  Vital signs in last 24 hours: Temp:  [97.5 F (36.4 C)-98.3 F (36.8 C)] 98.3 F (36.8 C) (09/03 1302) Pulse Rate:  [60-67] 62 (09/03 1302) Resp:  [18-20] 18 (09/03 1302) BP: (90-141)/(44-66) 108/49 mmHg (09/03 1410) SpO2:  [95 %-99 %] 99 % (09/03 1302) Constitutional:  Alert and oriented, No acute distress Cardiovascular: Regular rate and rhythm, No JVD Respiratory: Normal respiratory effort, Lungs clear bilaterally GI: Abdomen is soft, mildly nondistended, no abdominal masses. She is tender to palpation diffusely but more so in her RUQ.  No rebound tenderness or guarding. GU: No CVA tenderness Lymphatic: No lymphadenopathy Neurologic: Grossly intact, no focal deficits Psychiatric: Normal mood and affect  Laboratory Data:   Recent Labs  04/14/14 1445 04/15/14 0435  WBC 11.7* 11.3*  HGB 10.8* 10.3*  HCT 34.4* 33.1*  PLT 233 248     Recent Labs  04/14/14 1445 04/15/14 0435  NA 143 138  K 3.7 3.7  CL 96 94*  GLUCOSE 91 99  BUN 48* 48*  CALCIUM 8.0* 7.8*  CREATININE 2.85* 2.99*     Results for orders placed during the hospital encounter of 04/14/14 (from the past 24 hour(s))  CBC     Status: Abnormal   Collection Time    04/15/14  4:35 AM      Result Value Ref Range   WBC 11.3 (*) 4.0 - 10.5 K/uL   RBC 3.70 (*) 3.87 - 5.11 MIL/uL   Hemoglobin 10.3 (*) 12.0 - 15.0 g/dL   HCT 33.1 (*) 36.0 - 46.0 %   MCV 89.5  78.0 - 100.0 fL   MCH 27.8  26.0 - 34.0 pg   MCHC 31.1  30.0 - 36.0  g/dL   RDW 14.2  11.5 - 15.5 %   Platelets 248  150 - 400 K/uL  COMPREHENSIVE METABOLIC PANEL     Status: Abnormal   Collection Time    04/15/14  4:35 AM      Result Value Ref Range   Sodium 138  137 - 147 mEq/L   Potassium 3.7  3.7 - 5.3 mEq/L   Chloride 94 (*) 96 - 112 mEq/L   CO2 34 (*) 19 - 32 mEq/L   Glucose, Bld 99  70 - 99 mg/dL   BUN 48 (*) 6 - 23 mg/dL   Creatinine, Ser 2.99 (*) 0.50 - 1.10 mg/dL   Calcium 7.8 (*) 8.4 - 10.5 mg/dL   Total Protein 5.4 (*) 6.0 - 8.3 g/dL   Albumin 2.5 (*) 3.5 - 5.2 g/dL   AST 21  0 - 37 U/L   ALT 10  0 - 35 U/L   Alkaline Phosphatase 94  39 - 117 U/L   Total Bilirubin 0.3  0.3 - 1.2 mg/dL   GFR calc non Af Amer 14 (*) >90 mL/min   GFR calc Af Amer 16 (*) >90 mL/min   Anion gap 10  5 - 15  PROTIME-INR     Status: Abnormal   Collection Time    04/15/14  4:35 AM      Result Value Ref Range   Prothrombin Time 33.7 (*) 11.6 - 15.2 seconds   INR 3.32 (*) 0.00 - 1.49  GLUCOSE, CAPILLARY     Status: None   Collection Time  04/15/14  7:40 AM      Result Value Ref Range   Glucose-Capillary 88  70 - 99 mg/dL  ALBUMIN, FLUID     Status: None   Collection Time    04/15/14  2:16 PM      Result Value Ref Range   Albumin, Fluid 1.9     Fluid Type-FALB Peritoneal    BODY FLUID CELL COUNT WITH DIFFERENTIAL     Status: Abnormal   Collection Time    04/15/14  2:16 PM      Result Value Ref Range   Fluid Type-FCT PERITONEAL     Color, Fluid RED (*) YELLOW   Appearance, Fluid TURBID (*) CLEAR   WBC, Fluid 826  0 - 1000 cu mm   Neutrophil Count, Fluid 47 (*) 0 - 25 %   Lymphs, Fluid 10     Monocyte-Macrophage-Serous Fluid 42 (*) 50 - 90 %   Eos, Fluid 1     Other Cells, Fluid LINING CELLS PRESENT     No results found for this or any previous visit (from the past 240 hour(s)).  Renal Function:  Recent Labs  04/14/14 1445 04/15/14 0435  CREATININE 2.85* 2.99*   Estimated Creatinine Clearance: 15.4 ml/min (by C-G formula based on Cr of  2.99).  Radiologic Imaging: US Renal  04/15/2014   CLINICAL DATA:  Hydronephrosis  EXAM: RENAL/URINARY TRACT ULTRASOUND COMPLETE  COMPARISON:  CT abdomen and pelvis 04/13/2014  FINDINGS: Right Kidney:  Length: 10.5 cm. Cortical thinning. Upper normal cortical echogenicity. Mild hydronephrosis similar to prior CT. No mass or shadowing calcification.  Left Kidney:  Length: 7.4 cm. Atrophic appearance. Minimal collecting system dilatation. Linear echogenicity at inferior pole likely reflects indwelling LEFT ureteral stent seen on prior CT. No mass or shadowing calcification.  Bladder:  Decompressed by Foley catheter, not adequately visualized.  Ascites noted.  Overall image quality degraded secondary to body habitus.  IMPRESSION: Atrophic LEFT kidney with minimal collecting system dilatation and only partial visualization of the LEFT ureteral stent seen on CT.  Mild RIGHT hydronephrosis similar to prior CT.  Ascites.   Electronically Signed   By: Lavonia Dana M.D.   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 Request is made for diagnostic and therapeutic paracentesis up to 3 liters.  EXAM: ULTRASOUND GUIDED DIAGNOSTIC AND THERAPEUTIC PARACENTESIS  COMPARISON:  None.  PROCEDURE: An ultrasound guided paracentesis was thoroughly discussed with the patient and questions answered. The benefits, risks, alternatives and complications were also discussed. The patient understands and wishes to proceed with the procedure. Written consent was obtained.  Ultrasound was performed to localize and mark an adequate pocket of fluid in the left lower quadrant of the abdomen. The area was then prepped and draped in the normal sterile fashion. 1% Lidocaine was used for local anesthesia. Under ultrasound guidance a 19 gauge Yueh catheter was introduced. Paracentesis was performed. The catheter was removed and a  dressing applied.  Complications: None.  FINDINGS: A total of approximately 2.8 liters of blood-tinged fluid was removed. A fluid sample was sent for laboratory analysis.  IMPRESSION: Successful ultrasound guided diagnostic and therapeutic paracentesis yielding 2.8 liters of ascites.  Read by: Rowe Robert, PA-C   Electronically Signed   By: Sandi Mariscal M.D.   On: 04/15/2014 14:45   Dg Abd Portable 1v  04/14/2014   CLINICAL DATA:  Pain.  EXAM: PORTABLE ABDOMEN - 1 VIEW  COMPARISON:  CT 04/13/2014.  KUB 8/26 /2015.  FINDINGS: Soft tissue structures are unremarkable. Mild colonic distention noted. No evidence of progressive bowel distention or free air. Double-J ureteral stent noted on the left. Basilar atelectasis. Cardiac pacing leads. Cardiac valve replacement. Changes of bilateral sacroiliitis noted.  IMPRESSION: 1. Mild colonic distention noted. No evidence of progressive colonic distention or free air. 2. Double-J left ureteral stent in good anatomic position. 3. Bilateral sacroiliitis. 4. Bibasilar atelectasis. 5. Cardiac pacer and cardiac valve replacement.   Electronically Signed   By: Marcello Moores  Register   On: 04/14/2014 21:17    I independently reviewed the above imaging studies.  Impression/Recommendation: 1) Right hydronephrosis with rising Cr: I spoke with Ms. Lax and her daughters today and discussed her situation in detail.  I explained that while her RUQ pain and her elevation of her Cr from baseline certainly are consistent with right ureteral obstruction, there may be other factors involved as well.  I gave her the option of right ureteral stent placement with the goal of possibly improving her pain and improving her renal function which may extend her life (or percutaneous nephrostomy placement alternatively) vs no intervention with the understanding that she may progress into renal failure.  Considering her poor prognosis, I think either option is very reasonable.  She is leaning toward  proceeding with right ureteral stent placement but would like to discuss her options with her daughters more.  We did review the potential risks/complications and expected outcomes associated with stent placement and the risks of a general anesthetic. She will tentatively plan for cystoscopy and stent change but I will wait until I talk with her in the morning to confirm her decision prior to scheduling her procedure.  2) Locally advanced and probable metastatic urothelial carcinoma: Palliative care per medical oncology recommendations.  Shaquaya Wuellner,LES 04/15/2014, 6:25 PM  Pryor Curia. MD   CC: Dr. Hartford Poli

## 2014-04-15 NOTE — Care Management Note (Addendum)
    Page 1 of 1   04/16/2014     2:38:38 PM CARE MANAGEMENT NOTE 04/16/2014  Patient:  Nichole Pena, Nichole Pena   Account Number:  [id number]  Date Initiated:  04/15/2014  Documentation initiated by:  Dessa Phi  Subjective/Objective Assessment:   80 Y/O F ADMITTED W/ABD PAIN.HX:L URETER STENT,UROTHELIAL CA.     Action/Plan:   FROM HOME.ACTIVE [place].   Anticipated DC Date:  04/16/2014   Anticipated DC Plan:  [place]  CM consult      [facility] Choice  Resumption Of Svcs/PTA Provider   Choice offered to / List presented to:  C-1 Patient           Status of service:  In process, will continue to follow Medicare Important Message given?   (If response is "NO", the following Medicare IM given date fields will be blank) Date Medicare IM given:   Medicare IM given by:   Date Additional Medicare IM given:   Additional Medicare IM given by:    Discharge Disposition:    Per UR Regulation:  Reviewed for med. necessity/level of care/duration of stay  If discussed at [place] of Stay Meetings, dates discussed:    Comments:  04/16/14 Caetano Oberhaus RN,BSN NCM [phone] S/P [place].L URETER STENT TODAY.NOTED ONCOLOGY STATES MAY NEED HOSPICE REFERRAL.RECOMMEND PALLIATIVE CONS.MD UPDATED.  04/15/14 [place] RN,BSN NCM [place] HHRN/OT-PATTY TEL#[phone]/FAX#[phone].WILL NEED @ D/C D/C SUMMARY OR H&P, & HHC ORDERS,FACE TO FACE.PT-RECOMMENDED HH,3N1,W/C.AWAIT FINAL HHC,DME ORDERS.

## 2014-04-15 NOTE — Progress Notes (Signed)
ANTICOAGULATION CONSULT NOTE - Follow up [place] for Warfarin Indication: Afib/ Mechanical MVR  Allergies  Allergen Reactions  . Gabapentin Other (See Comments)    Causes CHF  . Wellbutrin [Bupropion] Nausea Only    Patient Measurements: Height: 5\' 1"  (154.9 cm) Weight: 206 lb (93.44 kg) IBW/kg (Calculated) : 47.8  Vital Signs: Temp: 98.3 F (36.8 C) (09/03 1302) Temp src: Oral (09/03 1302) BP: 118/44 mmHg (09/03 1302) Pulse Rate: 62 (09/03 1302)  Labs:  Recent Labs  04/14/14 1444 04/14/14 1445 04/15/14 0435  HGB  --  10.8* 10.3*  HCT  --  34.4* 33.1*  PLT  --  233 248  APTT 103*  --   --   LABPROT 58.3*  --  33.7*  INR 6.69*  --  3.32*  CREATININE  --  2.85* 2.99*    Estimated Creatinine Clearance: 15.4 ml/min (by C-G formula based on Cr of 2.99).  Assessment: 32 yoF presents from OSH with ARF and supratherapeutic INR.  Note pt on chronic anticoagulation with warfarin for hx Afib and mechanical MVR, also with hx intermittent rectal bleeding.  Other pertinent PMHx includes recent dx of adenocarcioma, PHTN, CHF, DJD, DDD, anemia, and diverticulosis.   Pharmacy consulted to dose warfarin while inpatient.    Prior to admission dosing information:  Pt's daughterstates home dose warfarin very hard to manage, usual dose is alternating 5mg  and 7.5mg . Warfarin managed through [facility] in [place].   [facility] in [place] [phone]) records show INR 1.2 up to 6 with doses ranging from 5mg  - 7.5mg   [facility] records indicate admission INR of 13.1 on 9/1 and was given Vitamin K 5mg  PO.  On 9/2 her INR had decreased to 12.6 and was given Vitamin K 5mg  SubQ.   Today, 9/2  INR 3.32, is therapeutic for higher goal range.  She required Vitamin K to decrease from INR of 6.69 yesterday.  CBC: Hgb 10.3 is mildly decreased, Plt WNL.  No bleeding or complications reported.  SCr increased to 2.99, CrCl ~ 15  ml/min  Drug-drug interactions: Vitamin K doses (9/1-9/2) will likely suppress INR for several days.  Amiodarone may increase warfarin concentrations, but patient has been on prior to admission.  S/p US guided diagnostic/therapeutic paracentesis (2.8 L blood-tinged fluid)    Goal of Therapy:  INR 2.5-3.5 Monitor platelets by anticoagulation protocol: Yes   Plan:   Warfarin 0.5 mg PO tonight at 1800  Very conservative dose resumption d/t recent supra-therapeutic levels.  Daily INR, CBC   Gretta Arab PharmD, BCPS Pager [phone] 04/15/2014 2:24 PM

## 2014-04-15 NOTE — Procedures (Signed)
US guided diagnostic/therapeutic paracentesis performed yielding 2.8 liters blood-tinged fluid. A portion of the fluid was sent to the lab for preordered studies. No immediate complications.

## 2014-04-15 NOTE — Progress Notes (Addendum)
INITIAL NUTRITION ASSESSMENT  DOCUMENTATION CODES Per approved criteria  -Obesity Unspecified -Non-severe (moderate) malnutrition in context of chronic illness  Pt meets criteria for moderate MALNUTRITION in the context of chronic illness as evidenced by PO intake < 75% for one month, moderate subcutaneous fat in upper arm region   INTERVENTION: -Reviewed high calorie/high protein snacks to incorporate into diet -Encouraged intake of small frequent meals -Will continue to monitor  NUTRITION DIAGNOSIS: Inadequate oral intake related to decreased appetite/early satiety as evidenced by poor PO intake for one month.   Goal: Pt to meet >/= 90% of their estimated nutrition needs    Monitor:  Total protein/enerygy intake, labs, weights  Reason for Assessment: Consult to Assess  80 y.o. female  Admitting Dx: Abdominal pain, unspecified site  ASSESSMENT: 80 year old female with past medical history significant of atrial fibrillation, on anticoagulation with coumadin, rate controlled with amiodarone, constipation, recent left ureteral obstruction and stent placement, had ill-defined mass surrounding the renal pelvis extending into the left ureter  -Pt's family reported a decreased appetite for past one month. Has been consuming one meal daily and snacks (usually sweets-Poptarts, sweet rolls). Dislikes Ensure/Boost, daughter has been preparing millkshakes/smoothies with protein powder to encourage protein intake -Discussed high kcal/protein snacks to incorporate into diet for increased nutrient density  -Reviewed smoothie ideas and use of alternative protein sources as daughter noted pt disliked whey powder aftertaste -Current PO intake 50-100%  -Endorsed some weight loss. Was unable to determine a usual weight d/t fluid accumulations -Abd xray negative for obstruction. Last BM on 8/27, passing flatus -Abd [place] pending for evaluation of ascites     Height: Ht Readings from Last 1  Encounters:  04/14/14 5\' 1"  (1.549 m)    Weight: Wt Readings from Last 1 Encounters:  04/14/14 206 lb (93.44 kg)    Ideal Body Weight: 105 lbs  % Ideal Body Weight: 196%  Wt Readings from Last 10 Encounters:  04/14/14 206 lb (93.44 kg)  04/02/14 206 lb (93.441 kg)  03/24/14 202 lb 14.4 oz (92.035 kg)  03/23/14 199 lb (90.266 kg)  03/23/14 199 lb (90.266 kg)  03/16/14 199 lb 6 oz (90.436 kg)  03/10/14 201 lb (91.173 kg)  03/09/14 201 lb 6 oz (91.343 kg)  03/05/14 202 lb 4.8 oz (91.763 kg)  01/26/14 205 lb (92.987 kg)    Usual Body Weight: unable to determine  % Usual Body Weight: unable to determine  BMI:  Body mass index is 38.94 kg/(m^2). Obesity II  Estimated Nutritional Needs: Kcal: 1500-1700 Protein: 80-95 gram Fluid: per MD  Skin: LE edema  Diet Order: General  EDUCATION NEEDS: -Education needs addressed   Intake/Output Summary (Last 24 hours) at 04/15/14 1124 Last data filed at 04/15/14 0700  Gross per 24 hour  Intake   1160 ml  Output    375 ml  Net    785 ml    Last BM: 8/27   Labs:   Recent Labs Lab 04/14/14 1445 04/15/14 0435  NA 143 138  K 3.7 3.7  CL 96 94*  CO2 38* 34*  BUN 48* 48*  CREATININE 2.85* 2.99*  CALCIUM 8.0* 7.8*  MG 4.9*  --   PHOS 3.4  --   GLUCOSE 91 99    CBG (last 3)  No results found for this basename: GLUCAP,  in the last 72 hours  Scheduled Meds: . amiodarone  200 mg Oral q morning - 10a  . beta carotene w/minerals  1 tablet Oral BID  .  bisacodyl  10 mg Rectal Once  . escitalopram  20 mg Oral q morning - 10a  . nystatin  5 mL Oral QID  . pantoprazole  40 mg Oral Daily  . polyethylene glycol  17 g Oral QHS  . pramipexole  0.5 mg Oral BID  . QUEtiapine  50 mg Oral QHS  . sodium chloride  3 mL Intravenous Q12H    Continuous Infusions:   Past Medical History  Diagnosis Date  . Anemia 2014  . Atrial fibrillation 2004  . Mitral valve prolapse   . DDD (degenerative disc disease)   . DJD  (degenerative joint disease)   . Depression   . GERD (gastroesophageal reflux disease)   . Macular degeneration disease   . Cataract   . Hydronephrosis, left   . Pacemaker   . Sleep apnea   . Shortness of breath     shortness of breath- 01/26/14  states x years  . History of blood transfusion   . Leg edema, left 01/25/14    swollen about 3 x size of right leg- toe to hip/ moderate reddness anterior lower leg, non draining- states has been like this.03-16-14 has improved someshat.  . Arthritis   . CHF (congestive heart failure)   . Diverticulosis   . History of oxygen administration     03-16-14 as needed at 3.5 l/m nasally. Not used lately.    Past Surgical History  Procedure Laterality Date  . Pacemaker insertion  2008    Mitral heart replacement  . Knee arthroscopy Right 2008  . Cataract extraction w/ intraocular lens implant Bilateral   . Cardiac valve replacement  2011    mechanical  . Tonsillectomy    . Joint replacement Right 2008    partial knee  . Cystoscopy with retrograde pyelogram, ureteroscopy and stent placement Left 02/02/2014    Procedure: CYSTOSCOPY WITH LEFT  RETROGRADE AND LEFT  STENT PLACEMENT;  Surgeon: Malka So, MD;  Location: [facility];  Service: Urology;  Laterality: Left;  . Dilation and curettage of uterus  03/23/14  . Cystoscopy w/ ureteral stent placement  03/23/14  . Examination under anesthesia N/A 03/23/2014    Procedure: EXAM UNDER ANESTHESIA;  Surgeon: Everitt Amber, MD;  Location: [facility];  Service: Gynecology;  Laterality: N/A;  . Dilation and curettage of uterus N/A 03/23/2014    Procedure: DILATATION AND CURETTAGE/ENDOCERVICAL BIOPSY ;  Surgeon: Everitt Amber, MD;  Location: [facility];  Service: Gynecology;  Laterality: N/A;  . Cystoscopy with ureteroscopy Left 03/23/2014    Procedure: CYSTOSCOPY WITH LEFT URETEROSCOPY LEFT URETERAL STENT CHANGE, LEFT RENAL PELVIS WASHINGS,BLADDER WASHINGS AND LEFT URETERAL BRUSH BIOPSY;  Surgeon: Raynelle Bring, MD;  Location:  [facility];  Service: Urology;  Laterality: Left;    Atlee Abide MS RD [place] Clinical Dietitian [phone]

## 2014-04-15 NOTE — Progress Notes (Signed)
IP PROGRESS NOTE  Subjective:   Patient known to me from recent consultation on 04/02/2014. Please see my full consult note on that date. 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 She was hospitalized on 04/14/2014 with symptoms of abdominal distention, nausea and failure to thrive. CT scan images on 04/13/2014 showed increased ascites and right-sided hydronephrosis. Asked to comment about her malignancy today. Clinically, she has improved very slightly does still have some abdominal discomfort. She does not report any fevers chills or sweats. She does not report any shortness of breath or difficulty breathing. His Mediport chest pain.   Objective:  Vital signs in last 24 hours: Temp:  [97.5 F (36.4 C)-97.7 F (36.5 C)] 97.5 F (36.4 C) (09/03 0616) Pulse Rate:  [58-67] 67 (09/03 0616) Resp:  [18-20] 18 (09/03 0616) BP: (90-141)/(42-66) 123/63 mmHg (09/03 0616) SpO2:  [95 %-97 %] 97 % (09/03 0616) Weight:  [206 lb (93.44 kg)] 206 lb (93.44 kg) (09/02 1309) Weight change:  Last BM Date: 04/08/14  Intake/Output from previous day: 09/02 0701 - 09/03 0700 In: 1160 [P.O.:360; I.V.:800] Out: 375 [Urine:375]  Mouth: mucous membranes moist, pharynx normal without lesions Resp: clear to auscultation bilaterally Cardio: regular rate and rhythm, S1, S2 normal, no murmur, click, rub or gallop GI: soft, non-tender; bowel sounds normal; no masses,  no organomegaly Extremities: extremities normal, atraumatic, no cyanosis or edema   Lab Results:  Recent Labs  04/14/14 1445 04/15/14 0435  WBC 11.7* 11.3*  HGB 10.8* 10.3*  HCT 34.4* 33.1*  PLT 233 248    BMET  Recent Labs  04/14/14 1445 04/15/14 0435  NA 143 138  K 3.7 3.7  CL 96 94*  CO2 38* 34*  GLUCOSE 91 99  BUN 48* 48*  CREATININE 2.85* 2.99*  CALCIUM 8.0* 7.8*    Studies/Results: Dg Abd Portable 1v  04/14/2014   CLINICAL DATA:  Pain.  EXAM: PORTABLE  ABDOMEN - 1 VIEW  COMPARISON:  CT 04/13/2014.  KUB 8/26 /2015.  FINDINGS: Soft tissue structures are unremarkable. Mild colonic distention noted. No evidence of progressive bowel distention or free air. Double-J ureteral stent noted on the left. Basilar atelectasis. Cardiac pacing leads. Cardiac valve replacement. Changes of bilateral sacroiliitis noted.  IMPRESSION: 1. Mild colonic distention noted. No evidence of progressive colonic distention or free air. 2. Double-J left ureteral stent in good anatomic position. 3. Bilateral sacroiliitis. 4. Bibasilar atelectasis. 5. Cardiac pacer and cardiac valve replacement.   Electronically Signed   By: Marcello Moores  Register   On: 04/14/2014 21:17    Medications: I have reviewed the patient's current medications.  Assessment/Plan:  80 year old woman with the following issues  1. 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 She presented with left hydronephrosis and an ill-defined mass surrounding the renal pelvis extending into the left ureter. Percutaneous biopsies has been indeterminant the most recent brushings immunohistochemical stains suggested locally advanced urothelial carcinoma. Disease appeared to be locally advanced at this time and potentially causing peritoneal metastasis and ascites.  These findings discussed with the patient and her family today and she is clearly not a candidate for any aggressive therapy. The best approach would be for her would be supportive care only and palliative care only intolerant. She'll probably benefit from hospice referral upon her discharge.  2. Abdominal ascites:CT scan was reviewed and showed moderate ascites. I agree with ultrasound and possible paracentesis. I would send the fluid for cytology as well.  3. The  right-sided hydronephrosis: I agree with urology evaluation she might need a percutaneous nephrostomy tube to palliate her renal failure.  All her questions are answered today please call with any  questions regarding her care.    LOS: 1 day   [facility] 04/15/2014, 12:39 PM

## 2014-04-15 NOTE — Evaluation (Signed)
Physical Therapy Evaluation Patient Details Name: Nichole Pena MRN: [id number] DOB: [birth year] Today's Date: 04/15/2014   History of Present Illness  80 year old female with past medical history significant of atrial fibrillation, pacemaker, constipation, recent left ureteral obstruction and stent placement, had ill-defined mass surrounding the renal pelvis extending into the left ureter, percutaneous biopsies has been indeterminant the most recent brushings immunohistochemical stains suggested locally advanced urothelial carcinoma.  Pt admitted with abdominal pain  Clinical Impression  Pt currently with functional limitations due to the deficits listed below (see PT Problem List).  Pt will benefit from skilled PT to increase their independence and safety with mobility to allow discharge to the venue listed below.  Pt reports increased weakness and fatigue.  Daughter reports decline in function at home requiring 24/7 assist the last 3 weeks and plans for pt to return home with 24/7 care.     Follow Up Recommendations Home health PT;Supervision/Assistance - 24 hour    Equipment Recommendations  3in1 (PT);Wheelchair (measurements PT)    Recommendations for Other Services       Precautions / Restrictions        Mobility  Bed Mobility Overal bed mobility: Needs Assistance Bed Mobility: Supine to Sit     Supine to sit: Mod assist     General bed mobility comments: verbal cues for technique, increased time, assist for trunk upright  Transfers Overall transfer level: Needs assistance Equipment used: Rolling walker (2 wheeled) Transfers: Sit to/from Stand Sit to Stand: Min assist;+2 safety/equipment         General transfer comment: verbal cues for hand placemnet  Ambulation/Gait Ambulation/Gait assistance: Min assist Ambulation Distance (Feet): 50 Feet Assistive device: Rolling walker (2 wheeled) Gait Pattern/deviations: Step-through pattern;Decreased stride length;Trunk  flexed Gait velocity: decr   General Gait Details: increased time and effort, pt reports fatigue and weakness, ambulated on 2L O2 with SpO2 93% upon returning to room  Stairs            Wheelchair Mobility    Modified Rankin (Stroke Patients Only)       Balance                                             Pertinent Vitals/Pain Pain Assessment: 0-10 Pain Score: 5  Pain Location: abdomen Pain Descriptors / Indicators: Constant;Discomfort Pain Intervention(s): Monitored during session;Repositioned (daughter reports pt has had tylenol)           SpO2 on room air 84% at rest, applied 2L O2 to improve to 95% prior to ambulation   Home Living Family/patient expects to be discharged to:: Private residence Living Arrangements: Alone Available Help at Discharge: Family;Available 24 hours/day (family has been 24/7 assist for last 3 weeks) Type of Home: [place]: One level Home Equipment: Walker - 4 wheels (rollator)      Prior Function Level of Independence: Independent with assistive device(s)         Comments: typically independent however daughter reports pt has had significant decline in function starting 3 weeks ago     Hand Dominance        Extremity/Trunk Assessment               Lower Extremity Assessment: Generalized weakness         Communication   Communication: Other (comment) (difficult to understand,  garbled speech - new per daughter)  Cognition Arousal/Alertness: Awake/alert Behavior During Therapy: WFL for tasks assessed/performed Overall Cognitive Status: Impaired/Different from baseline Area of Impairment: Orientation;Following commands Orientation Level: Disoriented to;Time;Situation     Following Commands: Follows one step commands with increased time       General Comments: daughter reports increased confusion which is not pt's baseline    General Comments      Exercises         Assessment/Plan    PT Assessment Patient needs continued PT services  PT Diagnosis Difficulty walking;Generalized weakness   PT Problem List Decreased strength;Decreased activity tolerance;Decreased balance;Decreased mobility;Decreased cognition;Decreased knowledge of use of DME  PT Treatment Interventions DME instruction;Gait training;Functional mobility training;Therapeutic activities;Therapeutic exercise;Patient/family education   PT Goals (Current goals can be found in the Care Plan section) Acute Rehab PT Goals PT Goal Formulation: With patient/family Time For Goal Achievement: 04/22/14 Potential to Achieve Goals: Good    Frequency Min 3X/week   Barriers to discharge        Co-evaluation               End of Session Equipment Utilized During Treatment: Gait belt;Oxygen Activity Tolerance: Patient limited by fatigue Patient left: in chair;with call bell/phone within reach;with chair alarm set           Time: [phone] PT Time Calculation (min): 21 min   Charges:   PT Evaluation $Initial PT Evaluation Tier I: 1 Procedure PT Treatments $Gait Training: 8-22 mins   PT G Codes:          [place] Clare,KATHrine E 04/15/2014, 10:12 AM Carmelia Bake, PT, DPT 04/15/2014 Pager: [phone]

## 2014-04-16 ENCOUNTER — Inpatient Hospital Stay (HOSPITAL_COMMUNITY): Payer: Medicare Other

## 2014-04-16 ENCOUNTER — Encounter (HOSPITAL_COMMUNITY)
Admission: AD | Disposition: A | Discharge status: Hospice | Payer: Self-pay | Source: Other Acute Inpatient Hospital | Attending: Internal Medicine

## 2014-04-16 DIAGNOSIS — E440 Moderate protein-calorie malnutrition: Secondary | ICD-10-CM

## 2014-04-16 DIAGNOSIS — R180 Malignant ascites: Secondary | ICD-10-CM

## 2014-04-16 DIAGNOSIS — K625 Hemorrhage of anus and rectum: Secondary | ICD-10-CM

## 2014-04-16 LAB — CBC
HCT: 31.1 % — ABNORMAL LOW (ref 36.0–46.0)
Hemoglobin: 9.5 g/dL — ABNORMAL LOW (ref 12.0–15.0)
MCH: 27.6 pg (ref 26.0–34.0)
MCHC: 30.5 g/dL (ref 30.0–36.0)
MCV: 90.4 fL (ref 78.0–100.0)
Platelets: 186 10*3/uL (ref 150–400)
RBC: 3.44 MIL/uL — ABNORMAL LOW (ref 3.87–5.11)
RDW: 14 % (ref 11.5–15.5)
WBC: 8.8 10*3/uL (ref 4.0–10.5)

## 2014-04-16 LAB — COMPREHENSIVE METABOLIC PANEL
ALT: 8 U/L (ref 0–35)
AST: 16 U/L (ref 0–37)
Albumin: 2.3 g/dL — ABNORMAL LOW (ref 3.5–5.2)
Alkaline Phosphatase: 90 U/L (ref 39–117)
Anion gap: 8 (ref 5–15)
BILIRUBIN TOTAL: 0.3 mg/dL (ref 0.3–1.2)
BUN: 52 mg/dL — ABNORMAL HIGH (ref 6–23)
CALCIUM: 7.4 mg/dL — AB (ref 8.4–10.5)
CO2: 36 meq/L — AB (ref 19–32)
Chloride: 93 mEq/L — ABNORMAL LOW (ref 96–112)
Creatinine, Ser: 3.32 mg/dL — ABNORMAL HIGH (ref 0.50–1.10)
GFR, EST AFRICAN AMERICAN: 14 mL/min — AB (ref >90)
GFR, EST NON AFRICAN AMERICAN: 12 mL/min — AB (ref >90)
GLUCOSE: 102 mg/dL — AB (ref 70–99)
Potassium: 3.5 mEq/L — ABNORMAL LOW (ref 3.7–5.3)
SODIUM: 137 meq/L (ref 137–147)
Total Protein: 4.9 g/dL — ABNORMAL LOW (ref 6.0–8.3)

## 2014-04-16 LAB — PROTIME-INR
INR: 1.83 — ABNORMAL HIGH (ref 0.00–1.49)
Prothrombin Time: 21.2 seconds — ABNORMAL HIGH (ref 11.6–15.2)

## 2014-04-16 LAB — TROPONIN I: Troponin I: <0.3 ng/mL (ref <0.30)

## 2014-04-16 LAB — GLUCOSE, CAPILLARY: GLUCOSE-CAPILLARY: 97 mg/dL (ref 70–99)

## 2014-04-16 SURGERY — CYSTOSCOPY, WITH RETROGRADE PYELOGRAM AND URETERAL STENT INSERTION
Anesthesia: General | Laterality: Right

## 2014-04-16 MED ORDER — MAGNESIUM CITRATE PO SOLN
1.0000 | Freq: Once | ORAL | Status: AC
  Administered 2014-04-16: 1 via ORAL
  Filled 2014-04-16: qty 296

## 2014-04-16 MED ORDER — SENNOSIDES-DOCUSATE SODIUM 8.6-50 MG PO TABS
2.0000 | ORAL_TABLET | Freq: Two times a day (BID) | ORAL | Status: DC
  Administered 2014-04-16: 2 via ORAL
  Filled 2014-04-16 (×3): qty 2

## 2014-04-16 MED ORDER — MORPHINE SULFATE 2 MG/ML IJ SOLN
1.0000 mg | INTRAMUSCULAR | Status: DC | PRN
  Administered 2014-04-16: 2 mg via INTRAVENOUS
  Filled 2014-04-16: qty 1

## 2014-04-16 MED ORDER — POLYETHYLENE GLYCOL 3350 17 G PO PACK
17.0000 g | PACK | Freq: Two times a day (BID) | ORAL | Status: DC
  Administered 2014-04-16: 17 g via ORAL
  Filled 2014-04-16 (×3): qty 1

## 2014-04-16 MED ORDER — OXYCODONE HCL 5 MG PO TABS
5.0000 mg | ORAL_TABLET | ORAL | Status: DC | PRN
  Administered 2014-04-17: 5 mg via ORAL
  Filled 2014-04-16: qty 1

## 2014-04-16 MED ORDER — WARFARIN SODIUM 2 MG PO TABS
2.0000 mg | ORAL_TABLET | Freq: Once | ORAL | Status: AC
  Administered 2014-04-16: 2 mg via ORAL
  Filled 2014-04-16: qty 1

## 2014-04-16 MED ORDER — OXYCODONE HCL 5 MG PO TABS
5.0000 mg | ORAL_TABLET | Freq: Four times a day (QID) | ORAL | Status: DC
  Administered 2014-04-16: 5 mg via ORAL
  Filled 2014-04-16: qty 1

## 2014-04-16 MED ORDER — HYDROMORPHONE HCL PF 1 MG/ML IJ SOLN: 0.5000 mg | INTRAMUSCULAR | Status: DC | PRN

## 2014-04-16 NOTE — Progress Notes (Signed)
PT Cancellation Note  Patient Details Name: Nichole Pena MRN: [id number] DOB: 06-09-1930   Cancelled Treatment:    Reason Eval/Treat Not Completed: Medical issues which prohibited therapy RN reports pt in a lot of pain today and received morphine.  Pt also has scheduled procedure at [facility] 04/16/2014, 3:00 PM Carmelia Bake, PT, DPT 04/16/2014 Pager: [phone]

## 2014-04-16 NOTE — Consult Note (Signed)
Patient SN:[id number] Nichole Pena      DOB: March 21, 1930      QBH:[id number]     Consult Note from the Palliative Medicine Team at [facility] Requested by: Dr Hartford Poli    PCP: Ernestene Kiel, MD Reason for Consultation: Goals of Care     Phone [place]  Assessment/Recommendations:  1.  Code Status: DNR, confirmed goal of comfort and I have placed DNR order  2. Goals of Care: Comfort. On my arrival this afternoon, family was discussing with Fleda about whether to pursue stent placement. Son in [place] is ER physician and suggested that [name] may not want to go through stent. I did inform them that stent placement might buy her more time, but concerns remained about what that time may look like for her.  Family discussed with Yeng and would not like to pursue stent placement at this time.  They would like to focus purely on comfort care.  We talked about potential hospice care which family is in agreement with . Daughter [name] thinks home hospice care may be difficult and would like to explore residential hospice care (preferably in [facility]). I think she would meet residential hospice criteria based on symptom management needs and having prognosis of likely weeks or less due to expected continued decline in renal function, decreased PO intake, rapidly declining functional status).  I have placed consult to social work to assist in residential hospice arrangements. Difficult question around what to do with anticoagulation given mechanical mitral valve. At this time family would like to pursue continuing warfrin here, but stopping at time of d/c from hospital.  They have good medical knowledge with nurses and physicians in family involved in medical decision making.     3. Symptom Management:   1. Pain- Taking around 1 oxycodone 7m per day. Gets som drowsiness with this.  I will go ahead and scheduled lower dose of [id number] and allow PRN dose as well. Given declining renal  function will d/c morphine and switch to PRN dilaudid which should be a safer alternative. 2. Constipation- Small BM other day but no good BM in over 1 week. KUB not c/w obstruction. Will increase bowel regimen.  (start doc/senna, BID miralax). Has had multiple enemas.  3. Malignant Ascites- had first paracentesis here.  Can do repeat tap in future if needed for comfort, but suspect prognosis so limited that this may not be beneficial.   4. Psychosocial/Spiritual: has 5 children.  Daughter ViJocelyn Lamerives closest to her.  Son [place] in [place] Also has daughter who is nurse.  Some spiritual background and family is open to chaplain consult which I have placed.  [place]     Brief HPI: Patient is an [age]o female with PMHx of Atrial Fibrillation, MVP with mechanical valve, urothelila cell carcinoma with left ureteral; obstruction. [facility] with worsening abdominal pain and constipation. Prior to this she was [facility]. Her stay was notable for worsening renal function and she actually underwent ureteral stent placement today.  She also was noted to have ascites with USKoreauided paracentesis performed yesterday and 2.8L of fluid removed.  Her ascites fluid cytology was consistent with malignant cells. Her primary Oncologist, Dr ShAlen Blewas followed in hospital and discussed with family that she is not a candidate for aggressive therapy.  Palliative consulted to further address goals of care including potential hospice care.   When I  met Nichole Pena today, family was in middle of discussing whether to proceed with ureteral stent placement today. Ultimately they decided to pursue comfort care.  Nichole Pena has not had good BM in over 1 week. Multiple enemas tried and only had small BM other day.  Feels constipated. Also has RLQ abdominal pain. Somewhat better after para.  Oxycodone helps but pain worse with movement and oxycodone makes her sleepy. Pain up  to 8/10 at times.  Reports that she is sleeping okay. Appetite poor and has been losing weight at home.      PMH:  Past Medical History  Diagnosis Date  . Anemia 2014  . Atrial fibrillation 2004  . Mitral valve prolapse   . DDD (degenerative disc disease)   . DJD (degenerative joint disease)   . Depression   . GERD (gastroesophageal reflux disease)   . Macular degeneration disease   . Cataract   . Hydronephrosis, left   . Pacemaker   . Sleep apnea   . Shortness of breath     shortness of breath- 01/26/14  states x years  . History of blood transfusion   . Leg edema, left 01/25/14    swollen about 3 x size of right leg- toe to hip/ moderate reddness anterior lower leg, non draining- states has been like this.03-16-14 has improved someshat.  . Arthritis   . CHF (congestive heart failure)   . Diverticulosis   . History of oxygen administration     03-16-14 as needed at 3.5 l/m nasally. Not used lately.     PSH: Past Surgical History  Procedure Laterality Date  . Pacemaker insertion  2008    Mitral heart replacement  . Knee arthroscopy Right 2008  . Cataract extraction w/ intraocular lens implant Bilateral   . Cardiac valve replacement  2011    mechanical  . Tonsillectomy    . Joint replacement Right 2008    partial knee  . Cystoscopy with retrograde pyelogram, ureteroscopy and stent placement Left 02/02/2014    Procedure: CYSTOSCOPY WITH LEFT  RETROGRADE AND LEFT  STENT PLACEMENT;  Surgeon: Malka So, MD;  Location: [facility];  Service: Urology;  Laterality: Left;  . Dilation and curettage of uterus  03/23/14  . Cystoscopy w/ ureteral stent placement  03/23/14  . Examination under anesthesia N/A 03/23/2014    Procedure: EXAM UNDER ANESTHESIA;  Surgeon: Everitt Amber, MD;  Location: [facility];  Service: Gynecology;  Laterality: N/A;  . Dilation and curettage of uterus N/A 03/23/2014    Procedure: DILATATION AND CURETTAGE/ENDOCERVICAL BIOPSY ;  Surgeon: Everitt Amber, MD;  Location: [facility];   Service: Gynecology;  Laterality: N/A;  . Cystoscopy with ureteroscopy Left 03/23/2014    Procedure: CYSTOSCOPY WITH LEFT URETEROSCOPY LEFT URETERAL STENT CHANGE, LEFT RENAL PELVIS WASHINGS,BLADDER WASHINGS AND LEFT URETERAL BRUSH BIOPSY;  Surgeon: Raynelle Bring, MD;  Location: [facility];  Service: Urology;  Laterality: Left;   I have reviewed the [place] and [facility] and  If appropriate update it with new information. Allergies  Allergen Reactions  . Gabapentin Other (See Comments)    Causes CHF  . Wellbutrin [Bupropion] Nausea Only   Scheduled Meds: . amiodarone  200 mg Oral q morning - 10a  . beta carotene w/minerals  1 tablet Oral BID  . escitalopram  20 mg Oral q morning - 10a  . magnesium citrate  1 Bottle Oral Once  . nystatin  5 mL Oral QID  . pantoprazole  40 mg Oral Daily  . polyethylene  glycol  17 g Oral QHS  . pramipexole  0.5 mg Oral BID  . QUEtiapine  50 mg Oral QHS  . sodium chloride  3 mL Intravenous Q12H  . warfarin  2 mg Oral ONCE-1800  . Warfarin - Pharmacist Dosing Inpatient   Does not apply q1800   Continuous Infusions:  PRN Meds:.acetaminophen, LORazepam, morphine injection, ondansetron (ZOFRAN) IV, oxyCODONE, polyvinyl alcohol    BP 101/48  Pulse 60  Temp(Src) 97.7 F (36.5 C) (Oral)  Resp 20  Ht 5' 1"  (1.549 m)  Wt 91.899 kg (202 lb 9.6 oz)  BMI 38.30 kg/m2  SpO2 93%   PPS:  30-40   Intake/Output Summary (Last 24 hours) at 04/16/14 1512 Last data filed at 04/16/14 0700  Gross per 24 hour  Intake    120 ml  Output    550 ml  Net   -430 ml    Physical Exam:  General: Alert, hard of hearing, NAD HEENT:  [place], dry mm Abdomen:soft, obese, distended, mild RLQ tenderness  Labs: CBC    Component Value Date/Time   WBC 8.8 04/16/2014 0527   RBC 3.44* 04/16/2014 0527   HGB 9.5* 04/16/2014 0527   HCT 31.1* 04/16/2014 0527   PLT 186 04/16/2014 0527   MCV 90.4 04/16/2014 0527   MCH 27.6 04/16/2014 0527   MCHC 30.5 04/16/2014 0527   RDW 14.0 04/16/2014 0527    BMET     Component Value Date/Time   NA 137 04/16/2014 0527   K 3.5* 04/16/2014 0527   CL 93* 04/16/2014 0527   CO2 36* 04/16/2014 0527   GLUCOSE 102* 04/16/2014 0527   BUN 52* 04/16/2014 0527   CREATININE 3.32* 04/16/2014 0527   CALCIUM 7.4* 04/16/2014 0527   GFRNONAA 12* 04/16/2014 0527   GFRAA 14* 04/16/2014 0527    CMP     Component Value Date/Time   NA 137 04/16/2014 0527   K 3.5* 04/16/2014 0527   CL 93* 04/16/2014 0527   CO2 36* 04/16/2014 0527   GLUCOSE 102* 04/16/2014 0527   BUN 52* 04/16/2014 0527   CREATININE 3.32* 04/16/2014 0527   CALCIUM 7.4* 04/16/2014 0527   PROT 4.9* 04/16/2014 0527   ALBUMIN 2.3* 04/16/2014 0527   AST 16 04/16/2014 0527   ALT 8 04/16/2014 0527   ALKPHOS 90 04/16/2014 0527   BILITOT 0.3 04/16/2014 0527   GFRNONAA 12* 04/16/2014 0527   GFRAA 14* 04/16/2014 0527   9/2 KUB IMPRESSION:  1. Mild colonic distention noted. No evidence of progressive colonic  distention or free air.  2. Double-J left ureteral stent in good anatomic position.  3. Bilateral sacroiliitis.  4. Bibasilar atelectasis.  5. Cardiac pacer and cardiac valve replacement.   9/3 Abd US IMPRESSION:  Successful ultrasound guided diagnostic and therapeutic paracentesis  yielding 2.8 liters of ascites.   9/3 Renal US IMPRESSION:  Atrophic LEFT kidney with minimal collecting system dilatation and  only partial visualization of the LEFT ureteral stent seen on CT.  Mild RIGHT hydronephrosis similar to prior CT.  Ascites.  Time In: 320 Time Out: 430 Total Time: 70 minutes  Greater than 50%  of this time was spent counseling and coordinating care related to the above assessment and plan.  Doran Clay D.O. Palliative Medicine Team at [facility]  Pager: [phone] Team Phone: [phone]

## 2014-04-16 NOTE — Progress Notes (Signed)
Patient ID: Nichole Pena, female   DOB: [birth year], 80 y.o.   MRN: [id number]    Subjective: Pt without new complaints.  Still with right sided abdominal discomfort.  Objective: Vital signs in last 24 hours: Temp:  [97.7 F (36.5 C)-98.3 F (36.8 C)] 97.7 F (36.5 C) (09/04 0455) Pulse Rate:  [60-74] 60 (09/04 0455) Resp:  [18-20] 20 (09/04 0455) BP: (101-127)/(44-60) 101/48 mmHg (09/04 0455) SpO2:  [93 %-99 %] 93 % (09/04 0455) Weight:  [91.899 kg (202 lb 9.6 oz)] 91.899 kg (202 lb 9.6 oz) (09/04 0544)  Intake/Output from previous day: 09/03 0701 - 09/04 0700 In: 120 [P.O.:120] Out: 3500 [Urine:700] Intake/Output this shift:    Physical Exam:  General: Alert and oriented  Lab Results:  Recent Labs  04/14/14 1445 04/15/14 0435 04/16/14 0527  HGB 10.8* 10.3* 9.5*  HCT 34.4* 33.1* 31.1*   BMET  Recent Labs  04/15/14 0435 04/16/14 0527  NA 138 137  K 3.7 3.5*  CL 94* 93*  CO2 34* 36*  GLUCOSE 99 102*  BUN 48* 52*  CREATININE 2.99* 3.32*  CALCIUM 7.8* 7.4*     Studies/Results:   Assessment/Plan: 1) R hydronephrosis and increasing Cr: She would like to proceed with right ureteral stent placement later today.  I discussed the potential benefits and risks of the procedure, side effects of the proposed treatment, the likelihood of the patient achieving the goals of the procedure, and any potential problems that might occur during the procedure or recuperation. She will be made NPO and placed on the OR schedule for late afternoon or early evening today.  I would expect her Cr to stabilize or improve after stent placement if her AKI is due to ureteral obstruction.   LOS: 2 days   Izik Bingman,LES 04/16/2014, 7:22 AM

## 2014-04-16 NOTE — Progress Notes (Signed)
[facility] PROGRESS NOTE   Nichole Pena NWG:[id number] DOB: [birth year] DOA: 04/14/2014 PCP: Ernestene Kiel, MD  HPI/Subjective: Seen with his daughter at bedside, still complaining about abdominal discomfort, RUQ. Seen by Dr. Alinda Money, for right-sided ureteral stent placement later today.  Assessment/Plan: Principal Problem:   Abdominal pain, unspecified site Active Problems:   Ureteral obstruction, left   Renal failure   Acute renal arterial thrombosis   Unspecified constipation   Malnutrition of moderate degree     Abdominal pain, constipation   Abdominal x-ray did not show any obstruction.  CT scan from [facility] available in the [place] her home meds.   Needs more aggressive treatment for the constipation after the placement of the ureteral stent.  Likely need magnesium citrate or SMOG enema.  Ascites  Patient has significant ascites, confirmed by CT scan, this is probably contributing to the SOB.  Unlikely patient has any type of PE as patient is on Coumadin.  Had paracentesis yesterday with removal of 2.8 bloody fluids.  Fluids sent for cytology.  Status post mitral valve replacement  Patient is on Coumadin for anticoagulation, yesterday INR was 6.69, today is 3.32.  This is at the upper end of therapeutic range.  Per daughter INR was recently >10. Restart it Coumadin, likely she will need heparin bridge  Atrial fibrillation   On anticoagulation with coumadin   Coumadin dosing per pharmacy.   Restart amiodarone   Telemetry monitoring   Advanced urothelial carcinoma  Followed by Dr. Alinda Money and Dr. Alen Blew. Recommended palliative/hospice consultation.  Lower extremity swelling  Restart home dose lasix  Acute renal failure  Possibly due to urothelial carcinoma, I will hold Lasix for today, hopefully she will get paracentesis. Patient has mild right-sided hydronephrosis, Dr. Alinda Money to evaluate.   Code Status:  Full code Family Communication: Plan discussed with the patient. Disposition Plan: Remains inpatient   Consultants: Oncology . Urology.  Procedures:  None  Antibiotics:  None   Objective: Filed Vitals:   04/16/14 0455  BP: 101/48  Pulse: 60  Temp: 97.7 F (36.5 C)  Resp: 20    Intake/Output Summary (Last 24 hours) at 04/16/14 1242 Last data filed at 04/16/14 0700  Gross per 24 hour  Intake    120 ml  Output   3500 ml  Net  -3380 ml   Filed Weights   04/14/14 1309 04/16/14 0544  Weight: 93.44 kg (206 lb) 91.899 kg (202 lb 9.6 oz)    Exam: General: Alert and awake, oriented x3, not in any acute distress. HEENT: anicteric sclera, pupils reactive to light and accommodation, EOMI CVS: S1-S2 clear, no murmur rubs or gallops Chest: clear to auscultation bilaterally, no wheezing, rales or rhonchi Abdomen: soft nontender, nondistended, normal bowel sounds, no organomegaly Extremities: no cyanosis, clubbing or edema noted bilaterally Neuro: Cranial nerves II-XII intact, no focal neurological deficits  Data Reviewed: Basic Metabolic Panel:  Recent Labs Lab 04/14/14 1445 04/15/14 0435 04/16/14 0527  NA 143 138 137  K 3.7 3.7 3.5*  CL 96 94* 93*  CO2 38* 34* 36*  GLUCOSE 91 99 102*  BUN 48* 48* 52*  CREATININE 2.85* 2.99* 3.32*  CALCIUM 8.0* 7.8* 7.4*  MG 4.9*  --   --   PHOS 3.4  --   --    Liver Function Tests:  Recent Labs Lab 04/14/14 1445 04/15/14 0435 04/16/14 0527  AST 17 21 16   ALT 10 10 8   ALKPHOS 95 94 90  BILITOT 0.2* 0.3  0.3  PROT 5.7* 5.4* 4.9*  ALBUMIN 2.6* 2.5* 2.3*   No results found for this basename: LIPASE, AMYLASE,  in the last 168 hours No results found for this basename: AMMONIA,  in the last 168 hours CBC:  Recent Labs Lab 04/14/14 1445 04/15/14 0435 04/16/14 0527  WBC 11.7* 11.3* 8.8  HGB 10.8* 10.3* 9.5*  HCT 34.4* 33.1* 31.1*  MCV 89.8 89.5 90.4  PLT 233 248 186   Cardiac Enzymes:  Recent Labs Lab  04/16/14 0955  TROPONINI <0.30   BNP (last 3 results) No results found for this basename: PROBNP,  in the last 8760 hours CBG:  Recent Labs Lab 04/15/14 0740 04/16/14 0752  GLUCAP 88 97    Micro Recent Results (from the past 240 hour(s))  BODY FLUID CULTURE     Status: None   Collection Time    04/15/14  2:16 PM      Result Value Ref Range Status   Specimen Description PERITONEAL   Final   Special Requests NONE   Final   Gram Stain     Final   Value: RARE WBC PRESENT, PREDOMINANTLY PMN     NO ORGANISMS SEEN     Performed at [facility]   Culture     Final   Value: NO GROWTH 1 DAY     Performed at [facility]   Report Status PENDING   Incomplete     Studies: Dg Abd 1 View  04/16/2014   CLINICAL DATA:  Abdominal pain and distention  EXAM: ABDOMEN - 1 VIEW  COMPARISON:  04/14/2013  FINDINGS: A left double-J ureteral stent is noted. Scattered colonic gas is seen without obstructive change. No free air is noted. No acute bony abnormality is seen.  IMPRESSION: Scattered colonic gas without obstructive change.  Stable left ureteral stent.   Electronically Signed   By: Inez Catalina M.D.   On: 04/16/2014 09:36   US Renal  04/15/2014   CLINICAL DATA:  Hydronephrosis  EXAM: RENAL/URINARY TRACT ULTRASOUND COMPLETE  COMPARISON:  CT abdomen and pelvis 04/13/2014  FINDINGS: Right Kidney:  Length: 10.5 cm. Cortical thinning. Upper normal cortical echogenicity. Mild hydronephrosis similar to prior CT. No mass or shadowing calcification.  Left Kidney:  Length: 7.4 cm. Atrophic appearance. Minimal collecting system dilatation. Linear echogenicity at inferior pole likely reflects indwelling LEFT ureteral stent seen on prior CT. No mass or shadowing calcification.  Bladder:  Decompressed by Foley catheter, not adequately visualized.  Ascites noted.  Overall image quality degraded secondary to body habitus.  IMPRESSION: Atrophic LEFT kidney with minimal collecting system dilatation  and only partial visualization of the LEFT ureteral stent seen on CT.  Mild RIGHT hydronephrosis similar to prior CT.  Ascites.   Electronically Signed   By: Lavonia Dana M.D.   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 Request is made for diagnostic and therapeutic paracentesis up to 3 liters.  EXAM: ULTRASOUND GUIDED DIAGNOSTIC AND THERAPEUTIC PARACENTESIS  COMPARISON:  None.  PROCEDURE: An ultrasound guided paracentesis was thoroughly discussed with the patient and questions answered. The benefits, risks, alternatives and complications were also discussed. The patient understands and wishes to proceed with the procedure. Written consent was obtained.  Ultrasound was performed to localize and mark an adequate pocket of fluid in the left lower quadrant of the abdomen. The area was then prepped  and draped in the normal sterile fashion. 1% Lidocaine was used for local anesthesia. Under ultrasound guidance a 19 gauge Yueh catheter was introduced. Paracentesis was performed. The catheter was removed and a dressing applied.  Complications: None.  FINDINGS: A total of approximately 2.8 liters of blood-tinged fluid was removed. A fluid sample was sent for laboratory analysis.  IMPRESSION: Successful ultrasound guided diagnostic and therapeutic paracentesis yielding 2.8 liters of ascites.  Read by: Rowe Robert, PA-C   Electronically Signed   By: Sandi Mariscal M.D.   On: 04/15/2014 14:45   Dg Abd Portable 1v  04/14/2014   CLINICAL DATA:  Pain.  EXAM: PORTABLE ABDOMEN - 1 VIEW  COMPARISON:  CT 04/13/2014.  KUB 8/26 /2015.  FINDINGS: Soft tissue structures are unremarkable. Mild colonic distention noted. No evidence of progressive bowel distention or free air. Double-J ureteral stent noted on the left. Basilar atelectasis. Cardiac pacing leads. Cardiac valve replacement. Changes of bilateral  sacroiliitis noted.  IMPRESSION: 1. Mild colonic distention noted. No evidence of progressive colonic distention or free air. 2. Double-J left ureteral stent in good anatomic position. 3. Bilateral sacroiliitis. 4. Bibasilar atelectasis. 5. Cardiac pacer and cardiac valve replacement.   Electronically Signed   By: Marcello Moores  Register   On: 04/14/2014 21:17    Scheduled Meds: . amiodarone  200 mg Oral q morning - 10a  . beta carotene w/minerals  1 tablet Oral BID  . escitalopram  20 mg Oral q morning - 10a  . nystatin  5 mL Oral QID  . pantoprazole  40 mg Oral Daily  . polyethylene glycol  17 g Oral QHS  . pramipexole  0.5 mg Oral BID  . QUEtiapine  50 mg Oral QHS  . sodium chloride  3 mL Intravenous Q12H  . Warfarin - Pharmacist Dosing Inpatient   Does not apply q1800   Continuous Infusions:       Time spent: 35 minutes    [facility]  [facility] Pager [phone] If 7PM-7AM, please contact night-coverage at [email or web address], password [facility] 04/16/2014, 12:42 PM  LOS: 2 days

## 2014-04-16 NOTE — Progress Notes (Signed)
Patient ID: Nichole Pena, female   DOB: November 18, 1929, 80 y.o.   MRN: [id number]  Pt reconsidered her decision for ureteral stent placement and has elected to proceed with strictly palliative care now.  Will cancel ureteral stent placement and will remain available if there is any change in her decision but will otherwise avoid urologic intervention at this time.

## 2014-04-16 NOTE — Progress Notes (Signed)
Anticoagulation Note  A/P: After Dr. Deitra Mayo saw patient to determine goals of care, decision was made to continue warfarin only at this time and not to bridge with IV heparin.  Adrian Saran, PharmD, BCPS Pager [phone] 04/16/2014 5:28 PM

## 2014-04-16 NOTE — Progress Notes (Signed)
Pt's family member came to the desk and stated that pt has decided not have surgery at this time. Bedside RN, OR, and Dr. Alinda Money made aware of patient's decision.

## 2014-04-16 NOTE — Progress Notes (Signed)
ANTICOAGULATION CONSULT NOTE - Follow up [facility] for Warfarin, Heparin Indication: Afib/ Mechanical MVR  Allergies  Allergen Reactions  . Gabapentin Other (See Comments)    Causes CHF  . Wellbutrin [Bupropion] Nausea Only    Patient Measurements: Height: 5\' 1"  (154.9 cm) Weight: 202 lb 9.6 oz (91.899 kg) IBW/kg (Calculated) : 47.8  Vital Signs: Temp: 97.7 F (36.5 C) (09/04 0455) Temp src: Oral (09/04 0455) BP: 101/48 mmHg (09/04 0455) Pulse Rate: 60 (09/04 0455)  Labs:  Recent Labs  04/14/14 1444  04/14/14 1445 04/15/14 0435 04/16/14 0527  HGB  --   < > 10.8* 10.3* 9.5*  HCT  --   --  34.4* 33.1* 31.1*  PLT  --   --  233 248 186  APTT 103*  --   --   --   --   LABPROT 58.3*  --   --  33.7* 21.2*  INR 6.69*  --   --  3.32* 1.83*  CREATININE  --   --  2.85* 2.99* 3.32*  < > = values in this interval not displayed.  Estimated Creatinine Clearance: 13.7 ml/min (by C-G formula based on Cr of 3.32).  Assessment: 11 yoF presents from OSH with ARF and supratherapeutic INR.  Note pt on chronic anticoagulation with warfarin for hx Afib and mechanical MVR, also with hx intermittent rectal bleeding.  Other pertinent PMHx includes recent dx of adenocarcioma, PHTN, CHF, DJD, DDD, anemia, and diverticulosis.   Pharmacy consulted to dose warfarin while inpatient.    Prior to admission dosing information:  Pt's daughterstates home dose warfarin very hard to manage, usual dose is alternating 5mg  and 7.5mg . Warfarin managed through [facility] in [place].   [facility] in [place] [phone]) records show INR 1.2 up to 6 with doses ranging from 5mg  - 7.5mg   [facility] records indicate admission INR of 13.1 on 9/1 and was given Vitamin K 5mg  PO.  On 9/2 her INR had decreased to 12.6 and was given Vitamin K 5mg  SubQ.  Significant Events: 9/3 paracentesis; resume warfarin dosing 9/4 right ureteral stent placement  planned  Today, 9/4  INR 1.83, sub-therapeutic  She required several doses of Vitamin K on 9/2 and 9/3 for supratherapeutic INR.  Resumed very low conservative dosing 9/3  Start heparin bridge after urological procedure 9/4 PM.  CBC: Hgb decreased to 9.5, Plt WNL.  No bleeding or complications reported.   S/p US guided diagnostic/therapeutic paracentesis (2.8 L blood-tinged fluid)  Planning ureteral stent placement today   SCr increased to 3.3, CrCl ~ 13 ml/min  Drug-drug interactions: Vitamin K doses (9/1-9/2) will likely suppress INR for several days.  Amiodarone may increase warfarin concentrations, but patient has been on prior to admission.    Goal of Therapy:  INR 2.5-3.5 Monitor platelets by anticoagulation protocol: Yes   Plan:   Warfarin 2 mg PO tonight at 1800  Daily INR, CBC  Begin IV Heparin bridge after urology procedure.   Gretta Arab PharmD, BCPS Pager [phone] 04/16/2014 2:41 PM

## 2014-04-17 DIAGNOSIS — R110 Nausea: Secondary | ICD-10-CM

## 2014-04-17 DIAGNOSIS — Z515 Encounter for palliative care: Secondary | ICD-10-CM

## 2014-04-17 LAB — BASIC METABOLIC PANEL
ANION GAP: 8 (ref 5–15)
BUN: 58 mg/dL — ABNORMAL HIGH (ref 6–23)
CALCIUM: 7.5 mg/dL — AB (ref 8.4–10.5)
CO2: 35 meq/L — AB (ref 19–32)
Chloride: 92 mEq/L — ABNORMAL LOW (ref 96–112)
Creatinine, Ser: 3.44 mg/dL — ABNORMAL HIGH (ref 0.50–1.10)
GFR calc Af Amer: 13 mL/min — ABNORMAL LOW (ref >90)
GFR calc non Af Amer: 12 mL/min — ABNORMAL LOW (ref >90)
GLUCOSE: 101 mg/dL — AB (ref 70–99)
POTASSIUM: 3.6 meq/L — AB (ref 3.7–5.3)
SODIUM: 135 meq/L — AB (ref 137–147)

## 2014-04-17 LAB — PROTIME-INR
INR: 1.59 — AB (ref 0.00–1.49)
Prothrombin Time: 19 seconds — ABNORMAL HIGH (ref 11.6–15.2)

## 2014-04-17 LAB — GLUCOSE, CAPILLARY: GLUCOSE-CAPILLARY: 92 mg/dL (ref 70–99)

## 2014-04-17 MED ORDER — ONDANSETRON HCL 4 MG/2ML IJ SOLN
4.0000 mg | Freq: Four times a day (QID) | INTRAMUSCULAR | Status: DC
  Administered 2014-04-17: 4 mg via INTRAVENOUS
  Filled 2014-04-17: qty 2

## 2014-04-17 MED ORDER — MORPHINE SULFATE (CONCENTRATE) 10 MG /0.5 ML PO SOLN: 5.0000 mg | ORAL | Status: AC | PRN

## 2014-04-17 MED ORDER — WARFARIN SODIUM 2 MG PO TABS
2.0000 mg | ORAL_TABLET | Freq: Once | ORAL | Status: DC
  Filled 2014-04-17: qty 1

## 2014-04-17 MED ORDER — OXYCODONE HCL 5 MG PO TABS
2.5000 mg | ORAL_TABLET | ORAL | Status: DC | PRN
  Administered 2014-04-17: 5 mg via ORAL
  Filled 2014-04-17: qty 1

## 2014-04-17 MED ORDER — LORAZEPAM 2 MG/ML PO CONC: 1.0000 mg | ORAL | Status: AC | PRN

## 2014-04-17 MED ORDER — PROCHLORPERAZINE EDISYLATE 5 MG/ML IJ SOLN: 5.0000 mg | Freq: Four times a day (QID) | INTRAMUSCULAR | Status: DC | PRN

## 2014-04-17 NOTE — Clinical Social Work Note (Signed)
[facility] has called regarding patient. Weekend Education officer, museum states that a Therapist, sports from the facility will come out to evaluate the patient for possible admission. CSW will continue to follow.   Liz Beach MSW, [place], [place], [phone]

## 2014-04-17 NOTE — Progress Notes (Signed)
Patient Nichole Pena      DOB: 21-May-1930      KPQ:[id number]   Palliative Medicine Team at [facility] Progress Note    Subjective: Had worsening nausea overnight after mag citrate. Still persists today, but a little better after zofran.  Pain controlled with oxycodone but makes her very sleepy.  Pain is usually only on palpation and movement.  Has not moved bowels overnight.  Family coming in from out of town and arriving today.  Only taking sips of water and no solid food.     Filed Vitals:   04/17/14 0540  BP: 108/45  Pulse: 71  Temp: 97.8 F (36.6 C)  Resp: 16   Physical exam: GEN: drowsy, but awakens and responds appropriately. Appears slightly weaker today HEENT: [place], sclera anicteric, mmm ABD; soft, mild to mod distension, mild RLQ tenderness Skin: warm/dry  CBC    Component Value Date/Time   WBC 8.8 04/16/2014 0527   RBC 3.44* 04/16/2014 0527   HGB 9.5* 04/16/2014 0527   HCT 31.1* 04/16/2014 0527   PLT 186 04/16/2014 0527   MCV 90.4 04/16/2014 0527   MCH 27.6 04/16/2014 0527   MCHC 30.5 04/16/2014 0527   RDW 14.0 04/16/2014 0527    INR/Prothrombin Time reviewed    Assessment and plan: 80 yo female with Afib, MVR on coumadin, metastatic urothelial carcinoma.  Goal now is comfort care  1. Code Status: DNR  2. Goals of Care:  See initial consult. Goal of comfort care.  SW met with family and arranging for residential hospice options.  Appreciate SW assistance.    3. Symptom Management:  1. Pain- Still gets sleepy even with 74m dose of oxycodone. Will make dose 2.5-563mPRN. Has PRN dilaudid IV for backup. 2. Constipation- continue bowel regimen.  Would avoid mag citrate as this unfortunately made her very nauseated 3. Malignant Ascites- had first paracentesis here. Can do repeat tap in future if needed for comfort, but suspect prognosis so limited that this may not be beneficial.  4. Nausea- would avoid mag citrate as this made worse.  Need to be cautious with other  laxatives orally which may make her sick. i will go ahead and schedule IV zofran. Add PRN compazine.  4. Psychosocial/Spiritual: has 5 children. Daughter ViJocelyn Lamerives closest to her. [place]. Some spiritual background and family is open to chaplain consult which I have placed[place]   Time in: 1115 Time Out: 1140 Total Time: 25 minutes  >50% of time spent in counseling and coordination of care regarding above plan  AaDoran Clay.O. Palliative Medicine [facility] at [facility]ager: [phone] Phone: [phone]

## 2014-04-17 NOTE — Progress Notes (Signed)
[facility] PROGRESS NOTE   Nichole Pena MBW:[id number] DOB: [birth year] DOA: 04/14/2014 PCP: Ernestene Kiel, MD  HPI/Subjective: Seen with daughters and son at bedside, patient is sleepy but easy to arouse. Complaining about abdominal pain, had very small bowel movement yesterday.  Assessment/Plan: Principal Problem:   Abdominal pain, unspecified site Active Problems:   Ureteral obstruction, left   Renal failure   Acute renal arterial thrombosis   Unspecified constipation   Malnutrition of moderate degree     Abdominal pain, constipation   Abdominal x-ray did not show any obstruction.  CT scan from [facility] available in the PACS system  Part of the abdominal pain is likely secondary to colonic distention, cannot rule out right hydronephrosis as cause.  Patient did not have significant bowel movements since admission.  Now she is nauseous to get like any oral laxatives.  Before I had any enemas, await palliative team recommendation.  Ascites  Patient has significant ascites, confirmed by CT scan, this is probably contributing to the SOB.  Unlikely patient has any type of PE as patient is on Coumadin.  Had paracentesis yesterday with removal of 2.8 bloody fluids.  Fluids sent for cytology. Unsure bloody fluids is secondary to supratherapeutic INR or metastatic cancer.  Status post mitral valve replacement  Patient is on Coumadin for anticoagulation, yesterday INR was 6.69.  This is at the upper end of therapeutic range.  Per daughter INR was recently >10. Restart it Coumadin.  INR is 1.59, await palliative team recommendation, if decided to restart anticoagulation she will need heparin bridge  Atrial fibrillation   On anticoagulation with coumadin   Coumadin dosing per pharmacy.   Restart amiodarone   Telemetry monitoring   Advanced urothelial carcinoma   Followed by Dr. Alinda Money and Dr. Alen Blew.  Recommended palliative/hospice  consultation.  Lower extremity swelling   Lasix held because of renal failure.  Acute renal failure   Possibly due to urothelial carcinoma, I will hold Lasix for today, hopefully she will get paracentesis.  Involving palliative care, stent was supposed to be placed, family and patient wants to hold on that.  Goals of care -Medical oncology recommending palliative care consultation. -Family is leaning towards palliative/hospice, they're waiting for her POA for health care Dr. Casandra Doffing her son. -Her son and POA is coming today from [place].   Code Status: Full code Family Communication: Plan discussed with the patient. Disposition Plan: Remains inpatient   Consultants: Oncology . Urology.  Procedures:  None  Antibiotics:  None   Objective: Filed Vitals:   04/17/14 0540  BP: 108/45  Pulse: 71  Temp: 97.8 F (36.6 C)  Resp: 16    Intake/Output Summary (Last 24 hours) at 04/17/14 1149 Last data filed at 04/17/14 0700  Gross per 24 hour  Intake    240 ml  Output    600 ml  Net   -360 ml   Filed Weights   04/14/14 1309 04/16/14 0544 04/17/14 0540  Weight: 93.44 kg (206 lb) 91.899 kg (202 lb 9.6 oz) 92.987 kg (205 lb)    Exam: General: Alert and awake, oriented x3, not in any acute distress. HEENT: anicteric sclera, pupils reactive to light and accommodation, EOMI CVS: S1-S2 clear, no murmur rubs or gallops Chest: clear to auscultation bilaterally, no wheezing, rales or rhonchi Abdomen: soft nontender, nondistended, normal bowel sounds, no organomegaly Extremities: no cyanosis, clubbing or edema noted bilaterally Neuro: Cranial nerves II-XII intact, no focal neurological deficits  Data Reviewed: Basic Metabolic Panel:  Recent Labs Lab 04/14/14 1445 04/15/14 0435 04/16/14 0527 04/17/14 0428  NA 143 138 137 135*  K 3.7 3.7 3.5* 3.6*  CL 96 94* 93* 92*  CO2 38* 34* 36* 35*  GLUCOSE 91 99 102* 101*  BUN 48* 48* 52* 58*  CREATININE 2.85* 2.99*  3.32* 3.44*  CALCIUM 8.0* 7.8* 7.4* 7.5*  MG 4.9*  --   --   --   PHOS 3.4  --   --   --    Liver Function Tests:  Recent Labs Lab 04/14/14 1445 04/15/14 0435 04/16/14 0527  AST 17 21 16   ALT 10 10 8   ALKPHOS 95 94 90  BILITOT 0.2* 0.3 0.3  PROT 5.7* 5.4* 4.9*  ALBUMIN 2.6* 2.5* 2.3*   No results found for this basename: LIPASE, AMYLASE,  in the last 168 hours No results found for this basename: AMMONIA,  in the last 168 hours CBC:  Recent Labs Lab 04/14/14 1445 04/15/14 0435 04/16/14 0527  WBC 11.7* 11.3* 8.8  HGB 10.8* 10.3* 9.5*  HCT 34.4* 33.1* 31.1*  MCV 89.8 89.5 90.4  PLT 233 248 186   Cardiac Enzymes:  Recent Labs Lab 04/16/14 0955  TROPONINI <0.30   BNP (last 3 results) No results found for this basename: PROBNP,  in the last 8760 hours CBG:  Recent Labs Lab 04/15/14 0740 04/16/14 0752 04/17/14 0728  GLUCAP 88 97 92    Micro Recent Results (from the past 240 hour(s))  BODY FLUID CULTURE     Status: None   Collection Time    04/15/14  2:16 PM      Result Value Ref Range Status   Specimen Description PERITONEAL   Final   Special Requests NONE   Final   Gram Stain     Final   Value: RARE WBC PRESENT, PREDOMINANTLY PMN     NO ORGANISMS SEEN     Performed at [facility]   Culture     Final   Value: NO GROWTH 1 DAY     Performed at [facility]   Report Status PENDING   Incomplete     Studies: Dg Abd 1 View  04/16/2014   CLINICAL DATA:  Abdominal pain and distention  EXAM: ABDOMEN - 1 VIEW  COMPARISON:  04/14/2013  FINDINGS: A left double-J ureteral stent is noted. Scattered colonic gas is seen without obstructive change. No free air is noted. No acute bony abnormality is seen.  IMPRESSION: Scattered colonic gas without obstructive change.  Stable left ureteral stent.   Electronically Signed   By: Inez Catalina M.D.   On: 04/16/2014 09:36   US Renal  04/15/2014   CLINICAL DATA:  Hydronephrosis  EXAM: RENAL/URINARY TRACT  ULTRASOUND COMPLETE  COMPARISON:  CT abdomen and pelvis 04/13/2014  FINDINGS: Right Kidney:  Length: 10.5 cm. Cortical thinning. Upper normal cortical echogenicity. Mild hydronephrosis similar to prior CT. No mass or shadowing calcification.  Left Kidney:  Length: 7.4 cm. Atrophic appearance. Minimal collecting system dilatation. Linear echogenicity at inferior pole likely reflects indwelling LEFT ureteral stent seen on prior CT. No mass or shadowing calcification.  Bladder:  Decompressed by Foley catheter, not adequately visualized.  Ascites noted.  Overall image quality degraded secondary to body habitus.  IMPRESSION: Atrophic LEFT kidney with minimal collecting system dilatation and only partial visualization of the LEFT ureteral stent seen on CT.  Mild RIGHT hydronephrosis similar to prior CT.  Ascites.   Electronically Signed   By: Elta Guadeloupe  Thornton Papas M.D.   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 Request is made for diagnostic and therapeutic paracentesis up to 3 liters.  EXAM: ULTRASOUND GUIDED DIAGNOSTIC AND THERAPEUTIC PARACENTESIS  COMPARISON:  None.  PROCEDURE: An ultrasound guided paracentesis was thoroughly discussed with the patient and questions answered. The benefits, risks, alternatives and complications were also discussed. The patient understands and wishes to proceed with the procedure. Written consent was obtained.  Ultrasound was performed to localize and mark an adequate pocket of fluid in the left lower quadrant of the abdomen. The area was then prepped and draped in the normal sterile fashion. 1% Lidocaine was used for local anesthesia. Under ultrasound guidance a 19 gauge Yueh catheter was introduced. Paracentesis was performed. The catheter was removed and a dressing applied.  Complications: None.  FINDINGS: A total of approximately 2.8 liters of blood-tinged fluid was  removed. A fluid sample was sent for laboratory analysis.  IMPRESSION: Successful ultrasound guided diagnostic and therapeutic paracentesis yielding 2.8 liters of ascites.  Read by: Rowe Robert, PA-C   Electronically Signed   By: Sandi Mariscal M.D.   On: 04/15/2014 14:45    Scheduled Meds: . amiodarone  200 mg Oral q morning - 10a  . beta carotene w/minerals  1 tablet Oral BID  . escitalopram  20 mg Oral q morning - 10a  . nystatin  5 mL Oral QID  . ondansetron (ZOFRAN) IV  4 mg Intravenous 4 times per day  . pantoprazole  40 mg Oral Daily  . polyethylene glycol  17 g Oral BID  . pramipexole  0.5 mg Oral BID  . QUEtiapine  50 mg Oral QHS  . senna-docusate  2 tablet Oral BID  . sodium chloride  3 mL Intravenous Q12H  . warfarin  2 mg Oral ONCE-1800  . Warfarin - Pharmacist Dosing Inpatient   Does not apply q1800   Continuous Infusions:       Time spent: 35 minutes    [facility] A  [facility] Pager [phone] If 7PM-7AM, please contact night-coverage at [email or web address], password [facility] 04/17/2014, 11:49 AM  LOS: 3 days

## 2014-04-17 NOTE — Clinical Social Work Psychosocial (Signed)
Clinical Social Work Department BRIEF PSYCHOSOCIAL ASSESSMENT 04/17/2014  Patient:  Nichole Pena, Nichole Pena     Account Number:  [id number]     Admit date:  04/14/2014  Clinical Social Worker:  Lovey Newcomer  Date/Time:  04/17/2014 11:06 AM  Referred by:  Physician  Date Referred:  04/17/2014 Referred for  Residential hospice placement   Other Referral:   NA   Interview type:  Family Other interview type:   Patient resting comfortably during assessment. Patient's two daughters and son interviewed.    PSYCHOSOCIAL DATA Living Status:  ALONE Admitted from facility:   Level of care:   Primary support name:  Lavetta Nielsen, Dr. Jeneen Rinks Primary support relationship to patient:  CHILD, ADULT Degree of support available:   Support is strong. Patient was living alone but her children helped care for her.    CURRENT CONCERNS Current Concerns  Post-Acute Placement   Other Concerns:   NA    SOCIAL WORK ASSESSMENT / PLAN CSW met with patient's children to complete assessment. Prior to admission patient lived alone at home where her children (daughter's mainly) provided care for her. Family reports that they have decided on comfort care for the patient and they have a preference for [facility]. CSW explained residential hospice placement process to family and answered questions. Family is open to other residential hospice facilities if [facility] is not available, but family strongly prefers this facility. Family is saddened to see the patient in this state and wants to do what is best for her. Family states that they would like to meet with Dr. Deitra Mayo again and they plan to have a discussion with the patient regarding goals of care.   Assessment/plan status:  Psychosocial Support/Ongoing Assessment of Needs Other assessment/ plan:   Residential hospice referrals have been made.   Information/referral to community resources:   Residential hospice list offered.     PATIENT'S/FAMILY'S RESPONSE TO PLAN OF CARE: Patient's family requesting residential hospice placement. CSW will follow up with available hospice facilities.       Liz Beach MSW, [place], [place], [phone]

## 2014-04-17 NOTE — Progress Notes (Signed)
Pt left with EMT at this time headed to [facility]. Writer gave report to nurse at [facility]. Family (daughters and son) at bedside and aware of transfer.

## 2014-04-17 NOTE — Clinical Social Work Note (Signed)
Per MD patient ready for DC. DC Summary faxed to facility. RN given number for report. DC packet on chart. CSW requested ambulance transport. Family notified. CSW signing off at this time. Liz Beach MSW, [place], [place], [phone]

## 2014-04-17 NOTE — Discharge Summary (Signed)
Physician Discharge Summary  Nichole Pena KDX:[id number] DOB: 08-19-1929 DOA: 04/14/2014  PCP: Ernestene Kiel, MD  Admit date: 04/14/2014 Discharge date: 04/17/2014  Time spent: 40 minutes  Recommendations for Outpatient Follow-up:  1. Followup with the hospice home M.D. Has advanced left-sided urothelial carcinoma. 2. Discharge to [facility] for pain and symptoms management. 3. All long-term medications discontinued, discharge on Roxanol and Lorazepam Intensol.  Discharge Diagnoses:  Principal Problem:   Abdominal pain, unspecified site Active Problems:   Ureteral obstruction, left   Renal failure   Acute renal arterial thrombosis   Unspecified constipation   Malnutrition of moderate degree   Discharge Condition: Fair  Diet recommendation: As tolerated for pleasure  Filed Weights   04/14/14 1309 04/16/14 0544 04/17/14 0540  Weight: 93.44 kg (206 lb) 91.899 kg (202 lb 9.6 oz) 92.987 kg (205 lb)    History of present illness:  80 year old female with past medical history significant of atrial fibrillation, on anticoagulation with coumadin, rate controlled with amiodarone, constipation, recent left ureteral obstruction and stent placement, had ill-defined mass surrounding the renal pelvis extending into the left ureter. Percutaneous biopsies has been indeterminant the most recent brushings immunohistochemical stains suggested locally advanced urothelial carcinoma, most recent visit with Dr. Alen Blew on oncology who has discussed possible treatment options especially should she develop metastatic disease (at which point she may benefit from palliative radiotherapy to control symptoms and may possibly be ok for platinum based chemotherapy). She presented to [facility] due to worsening abdominal pain, thought she was more constipated and has received smog enema at [facility]. Her symptoms did not improve. Transfer requested for further management.  At this time we will  repeat all blood work, try to upload all imaging information into EPIC so we would not have duplicate studies. She had CT abdomen w/o contrast in [place]. We will obtain abd x ray for evaluation of possible obstruction.  Hospital Course:   Abdominal pain, constipation  Abdominal x-ray did not show any obstruction.  CT scan from [facility] available in the PACS system  Part of the abdominal pain is likely secondary to colonic distention, cannot rule out right hydronephrosis as cause.  Patient did not have significant bowel movements since admission.  Now she is nauseous likely secondary to oral laxatives.  Discussed with her son Dr. Casandra Doffing, hold on further management including enemas. Ascites  Patient has significant ascites, confirmed by CT scan, this is probably contributing to the SOB.  Unlikely patient has any type of PE as patient is on Coumadin.  Had paracentesis yesterday with removal of 2.8 bloody fluids.  Fluids sent for cytology. Unsure bloody fluids is secondary to supratherapeutic INR or metastatic cancer. Status post mitral valve replacement  Patient is on Coumadin for anticoagulation, yesterday INR was 6.69.  This is at the upper end of therapeutic range.  Per daughter INR was recently >10. Restart it Coumadin.  INR is 1.59. Discussed in length with her son Dr. Casandra Doffing who is the POA for healthcare, decided to stop Coumadin.  Atrial fibrillation   Was on anticoagulation with coumadin  Coumadin  held at the time of admission because of supratherapeutic INR.   the time of discharge all medications discontinued including Coumadin.  Advanced urothelial carcinoma  Followed by Dr. Alinda Money and Dr. Alen Blew.  likely with abdominal metastasis.  Patient presented with no ascites, paracentesis showed bloody ascitic fluid, suspicious for intra-abdominal spread.  Recommended palliative/hospice consultation. Lower extremity swelling  Lasix held because of  renal  failure. Acute renal failure  Possibly due to urothelial carcinoma, and right-sided acute hydronephrosis.    initial ureteral stent was supposed to be placed, family decided to go for full comfort measures.  Goals of care   Medical oncology recommending palliative care consultation.   Palliative medicine team consulted, family is leaning towards hospice and full comfort.  Today her son Dr. Casandra Doffing (POA for health care) came in from [place] and decided she'll go for full comfort.  All long-term medications discontinued.  Patient discharged on Roxanol and Lorazepam Intensol.   Procedures:  None  Consultations:  Urology.  Medical oncology.  Palliative and hospice medicine.  Discharge Exam: Filed Vitals:   04/17/14 1411  BP: 123/54  Pulse: 68  Temp: 97.7 F (36.5 C)  Resp: 18   General: Sleepy, seen with family at bedside including her son [name] and daughter [name] HEENT: anicteric sclera, pupils reactive to light and accommodation, EOMI CVS: S1-S2 clear, no murmur rubs or gallops Chest: clear to auscultation bilaterally, no wheezing, rales or rhonchi Abdomen: Abdomen is distended, there is generalized tenderness but worse in the RUQ. Extremities: no cyanosis, is +1 edema bilaterally. Neuro: Cranial nerves II-XII intact, no focal neurological deficits  Discharge Instructions You were cared for by a hospitalist during your hospital stay. If you have any questions about your discharge medications or the care you received while you were in the hospital after you are discharged, you can call the unit and asked to speak with the hospitalist on call if the hospitalist that took care of you is not available. Once you are discharged, your primary care physician will handle any further medical issues. Please note that NO REFILLS for any discharge medications will be authorized once you are discharged, as it is imperative that you return to your primary care physician (or  establish a relationship with a primary care physician if you do not have one) for your aftercare needs so that they can reassess your need for medications and monitor your lab values.   Current Discharge Medication List    START taking these medications   Details  LORazepam (LORAZEPAM INTENSOL) 2 MG/ML concentrated solution Take 0.5 mLs (1 mg total) by mouth every 4 (four) hours as needed for anxiety. Qty: 30 mL, Refills: 0    Morphine Sulfate (MORPHINE CONCENTRATE) 10 mg / 0.5 ml concentrated solution Take 0.25 mLs (5 mg total) by mouth every 2 (two) hours as needed for severe pain. Qty: 30 mL, Refills: 0      STOP taking these medications     acetaminophen (TYLENOL) 650 MG CR tablet      amiodarone (PACERONE) 200 MG tablet      beta carotene w/minerals (OCUVITE) tablet      docusate sodium (COLACE) 100 MG capsule      escitalopram (LEXAPRO) 20 MG tablet      esomeprazole (NEXIUM) 40 MG capsule      furosemide (LASIX) 40 MG tablet      LORazepam (ATIVAN) 0.5 MG tablet      Magnesium 250 MG TABS      Oxycodone HCl 10 MG TABS      polyethylene glycol (MIRALAX / GLYCOLAX) packet      polyvinyl alcohol (LIQUIFILM TEARS) 1.4 % ophthalmic solution      potassium chloride SA (K-DUR,KLOR-CON) 20 MEQ tablet      pramipexole (MIRAPEX) 0.5 MG tablet      QUEtiapine (SEROQUEL) 50 MG tablet  warfarin (COUMADIN) 5 MG tablet        Allergies  Allergen Reactions  . Gabapentin Other (See Comments)    Causes CHF  . Wellbutrin [Bupropion] Nausea Only      The results of significant diagnostics from this hospitalization (including imaging, microbiology, ancillary and laboratory) are listed below for reference.    Significant Diagnostic Studies: Dg Abd 1 View  04/16/2014   CLINICAL DATA:  Abdominal pain and distention  EXAM: ABDOMEN - 1 VIEW  COMPARISON:  04/14/2013  FINDINGS: A left double-J ureteral stent is noted. Scattered colonic gas is seen without obstructive  change. No free air is noted. No acute bony abnormality is seen.  IMPRESSION: Scattered colonic gas without obstructive change.  Stable left ureteral stent.   Electronically Signed   By: Inez Catalina M.D.   On: 04/16/2014 09:36   Dg Retrograde Pyelogram  03/23/2014   CLINICAL DATA:  Left hydronephrosis.  Left ureter biopsy.  EXAM: RETROGRADE PYELOGRAM  COMPARISON:  CT 03/17/2014  FINDINGS: Four images were obtained of the left upper renal collecting system. There is a catheter extending into the left renal pelvic region. There is mild dilatation of the left renal calices. Left renal pelvis is decompressed or small.  IMPRESSION: Mild dilatation of left renal calices. Left ureter catheter or stent.   Electronically Signed   By: Markus Daft M.D.   On: 03/23/2014 08:50   US Renal  04/15/2014   CLINICAL DATA:  Hydronephrosis  EXAM: RENAL/URINARY TRACT ULTRASOUND COMPLETE  COMPARISON:  CT abdomen and pelvis 04/13/2014  FINDINGS: Right Kidney:  Length: 10.5 cm. Cortical thinning. Upper normal cortical echogenicity. Mild hydronephrosis similar to prior CT. No mass or shadowing calcification.  Left Kidney:  Length: 7.4 cm. Atrophic appearance. Minimal collecting system dilatation. Linear echogenicity at inferior pole likely reflects indwelling LEFT ureteral stent seen on prior CT. No mass or shadowing calcification.  Bladder:  Decompressed by Foley catheter, not adequately visualized.  Ascites noted.  Overall image quality degraded secondary to body habitus.  IMPRESSION: Atrophic LEFT kidney with minimal collecting system dilatation and only partial visualization of the LEFT ureteral stent seen on CT.  Mild RIGHT hydronephrosis similar to prior CT.  Ascites.   Electronically Signed   By: Lavonia Dana M.D.   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 Request is made for diagnostic and therapeutic  paracentesis up to 3 liters.  EXAM: ULTRASOUND GUIDED DIAGNOSTIC AND THERAPEUTIC PARACENTESIS  COMPARISON:  None.  PROCEDURE: An ultrasound guided paracentesis was thoroughly discussed with the patient and questions answered. The benefits, risks, alternatives and complications were also discussed. The patient understands and wishes to proceed with the procedure. Written consent was obtained.  Ultrasound was performed to localize and mark an adequate pocket of fluid in the left lower quadrant of the abdomen. The area was then prepped and draped in the normal sterile fashion. 1% Lidocaine was used for local anesthesia. Under ultrasound guidance a 19 gauge Yueh catheter was introduced. Paracentesis was performed. The catheter was removed and a dressing applied.  Complications: None.  FINDINGS: A total of approximately 2.8 liters of blood-tinged fluid was removed. A fluid sample was sent for laboratory analysis.  IMPRESSION: Successful ultrasound guided diagnostic and therapeutic paracentesis yielding 2.8 liters of ascites.  Read by: Rowe Robert, PA-C   Electronically Signed   By: Eldridge Abrahams.D.  On: 04/15/2014 14:45   Dg Abd Portable 1v  04/14/2014   CLINICAL DATA:  Pain.  EXAM: PORTABLE ABDOMEN - 1 VIEW  COMPARISON:  CT 04/13/2014.  KUB 8/26 /2015.  FINDINGS: Soft tissue structures are unremarkable. Mild colonic distention noted. No evidence of progressive bowel distention or free air. Double-J ureteral stent noted on the left. Basilar atelectasis. Cardiac pacing leads. Cardiac valve replacement. Changes of bilateral sacroiliitis noted.  IMPRESSION: 1. Mild colonic distention noted. No evidence of progressive colonic distention or free air. 2. Double-J left ureteral stent in good anatomic position. 3. Bilateral sacroiliitis. 4. Bibasilar atelectasis. 5. Cardiac pacer and cardiac valve replacement.   Electronically Signed   By: Marcello Moores  Register   On: 04/14/2014 21:17    Microbiology: Recent Results (from the  past 240 hour(s))  BODY FLUID CULTURE     Status: None   Collection Time    04/15/14  2:16 PM      Result Value Ref Range Status   Specimen Description PERITONEAL   Final   Special Requests NONE   Final   Gram Stain     Final   Value: RARE WBC PRESENT, PREDOMINANTLY PMN     NO ORGANISMS SEEN     Performed at [facility]   Culture     Final   Value: NO GROWTH 2 DAYS     Performed at [facility]   Report Status PENDING   Incomplete     Labs: Basic Metabolic Panel:  Recent Labs Lab 04/14/14 1445 04/15/14 0435 04/16/14 0527 04/17/14 0428  NA 143 138 137 135*  K 3.7 3.7 3.5* 3.6*  CL 96 94* 93* 92*  CO2 38* 34* 36* 35*  GLUCOSE 91 99 102* 101*  BUN 48* 48* 52* 58*  CREATININE 2.85* 2.99* 3.32* 3.44*  CALCIUM 8.0* 7.8* 7.4* 7.5*  MG 4.9*  --   --   --   PHOS 3.4  --   --   --    Liver Function Tests:  Recent Labs Lab 04/14/14 1445 04/15/14 0435 04/16/14 0527  AST 17 21 16   ALT 10 10 8   ALKPHOS 95 94 90  BILITOT 0.2* 0.3 0.3  PROT 5.7* 5.4* 4.9*  ALBUMIN 2.6* 2.5* 2.3*   No results found for this basename: LIPASE, AMYLASE,  in the last 168 hours No results found for this basename: AMMONIA,  in the last 168 hours CBC:  Recent Labs Lab 04/14/14 1445 04/15/14 0435 04/16/14 0527  WBC 11.7* 11.3* 8.8  HGB 10.8* 10.3* 9.5*  HCT 34.4* 33.1* 31.1*  MCV 89.8 89.5 90.4  PLT 233 248 186   Cardiac Enzymes:  Recent Labs Lab 04/16/14 0955  TROPONINI <0.30   BNP: BNP (last 3 results) No results found for this basename: PROBNP,  in the last 8760 hours CBG:  Recent Labs Lab 04/15/14 0740 04/16/14 0752 04/17/14 0728  GLUCAP 88 97 92       Signed:  Daija Routson A  [facility] 04/17/2014, 4:18 PM

## 2014-04-17 NOTE — Progress Notes (Signed)
ANTICOAGULATION CONSULT NOTE - Follow up [place] for Warfarin Indication: Afib/ Mechanical MVR  Allergies  Allergen Reactions  . Gabapentin Other (See Comments)    Causes CHF  . Wellbutrin [Bupropion] Nausea Only    Patient Measurements: Height: 5\' 1"  (154.9 cm) Weight: 205 lb (92.987 kg) IBW/kg (Calculated) : 47.8  Vital Signs: Temp: 97.8 F (36.6 C) (09/05 0540) Temp src: Oral (09/05 0540) BP: 108/45 mmHg (09/05 0540) Pulse Rate: 71 (09/05 0540)  Labs:  Recent Labs  04/14/14 1444  04/14/14 1445 04/15/14 0435 04/16/14 0527 04/16/14 0955 04/17/14 0428  HGB  --   < > 10.8* 10.3* 9.5*  --   --   HCT  --   --  34.4* 33.1* 31.1*  --   --   PLT  --   --  233 248 186  --   --   APTT 103*  --   --   --   --   --   --   LABPROT 58.3*  --   --  33.7* 21.2*  --  19.0*  INR 6.69*  --   --  3.32* 1.83*  --  1.59*  CREATININE  --   < > 2.85* 2.99* 3.32*  --  3.44*  TROPONINI  --   --   --   --   --  <0.30  --   < > = values in this interval not displayed.  Estimated Creatinine Clearance: 13.3 ml/min (by C-G formula based on Cr of 3.44).  Assessment: 57 yoF presents from OSH with ARF and supratherapeutic INR.  Note pt on chronic anticoagulation with warfarin for hx Afib and mechanical MVR, also with hx intermittent rectal bleeding.  Other pertinent PMHx includes recent dx of adenocarcioma, PHTN, CHF, DJD, DDD, anemia, and diverticulosis.   Pharmacy consulted to dose warfarin while inpatient.    Prior to admission dosing information:  Pt's daughterstates home dose warfarin very hard to manage, usual dose is alternating 5mg  and 7.5mg . Warfarin managed through [facility] in [place].   [facility] in [place] [phone]) records show INR 1.2 up to 6 with doses ranging from 5mg  - 7.5mg   [facility] records indicate admission INR of 13.1 on 9/1 and was given Vitamin K 5mg  PO.  On 9/2 her INR had decreased to 12.6 and was  given Vitamin K 5mg  SubQ.  Significant Events: 9/3 paracentesis; resume warfarin dosing 9/4 right ureteral stent placement planned - cancelled, patient opted for strictly palliative care now.  Today, 9/5  INR 1.59, sub-therapeutic and decreasing  She required several doses of Vitamin K on 9/2 and 9/3 for supratherapeutic INR.  Resumed very low conservative dosing 9/3  No heparin bridge needed as procedure cancelled  CBC: Hgb decreased to 9.5, Plt WNL (9/4)  No bleeding or complications reported.   S/p US guided diagnostic/therapeutic paracentesis (2.8 L blood-tinged fluid)  SCr increased to 3.44, CrCl ~ 13 ml/min  Drug-drug interactions: Vitamin K doses (9/1-9/2) will likely suppress INR for several days.  Amiodarone may increase warfarin concentrations, but patient has been on prior to admission.   Goal of Therapy:  INR 2.5-3.5 Monitor platelets by anticoagulation protocol: Yes   Plan:   Repeat warfarin 2 mg PO tonight at 1800  Daily INR, CBC  Peggyann Juba, PharmD, BCPS Pager: [phone]  04/17/2014 9:22 AM

## 2014-04-17 NOTE — Progress Notes (Signed)
Patient ID: Nichole Pena, female   DOB: 1930-02-07, 80 y.o.   MRN: [id number]   Pt is sleeping. I didn't wake her. Spoke with family. Pt firm in her decision for no right ureteral stent. I reviewed her Abd film and CT scan images. Will sign off. Call or reconsult GU if any issues.

## 2014-04-17 NOTE — Clinical Social Work Note (Signed)
Nichole Pena with [facility] contacted CSW to inform CSW that patient can be admitted to [facility] once family has made their decision about disposition. CSW updated daughter [name] who states that the family is waiting for [name] (from [place]) to arrive so the the family can make the decision collectively. CSW has asked [name] to contact CSW once family has made decision so that CSW can facilitate DC to [facility] if family chooses this. CSW will wait for call from [name].    Liz Beach MSW, [place], [place], [phone]

## 2014-04-18 LAB — BODY FLUID CULTURE: CULTURE: NO GROWTH

## 2014-05-13 DEATH — deceased

## 2015-06-05 IMAGING — CT CT CORE BIOPSY RENAL
2 series · 15 of 32 positions shown, 20 images · non-contrast
Comparison: none

CLINICAL DATA: 83-year-old female with ill-defined left
periureteral soft tissue. Differential considerations include
chronic inflammation, retroperitoneal fibrosis and atypical
malignancy such as lymphoma. The patient had a double-J ureteral
stent placed by urology earlier today to demarcate the location of
the ureter and now presents for CT-guided biopsy of the tissue.

[Series 5: (hospital) 6.0 b30f · axial · 0.59mm/px · z∈[+1113,+1114]mm · 8 of 48 slices shown]
[im 5/48  soft-tissue]
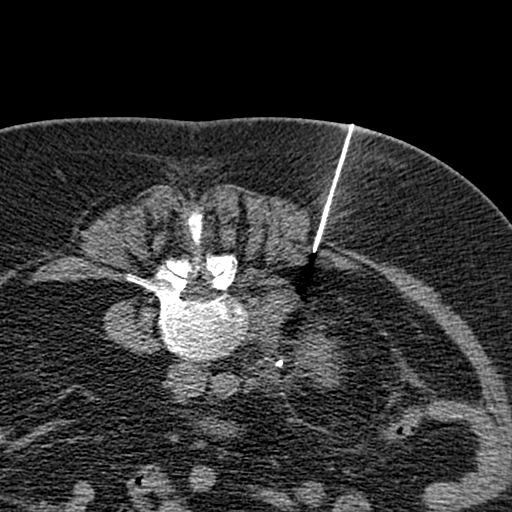
[im 9/48  soft-tissue]
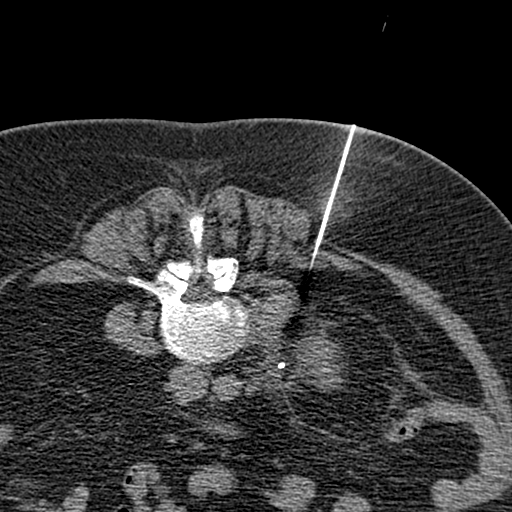
[im 15/48  soft-tissue]
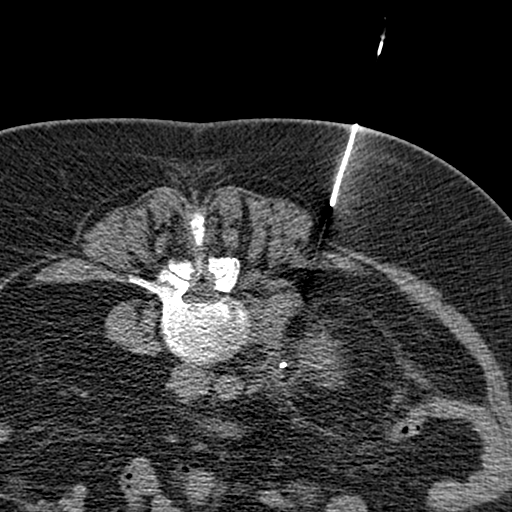
[im 22/48  soft-tissue]
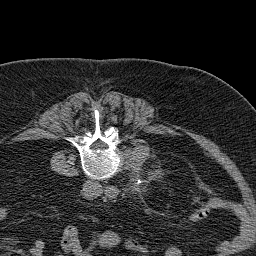
[im 26/48  soft-tissue]
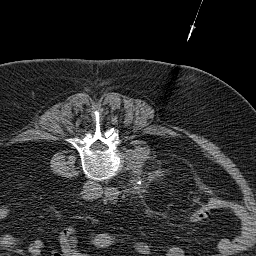
[im 33/48  soft-tissue]
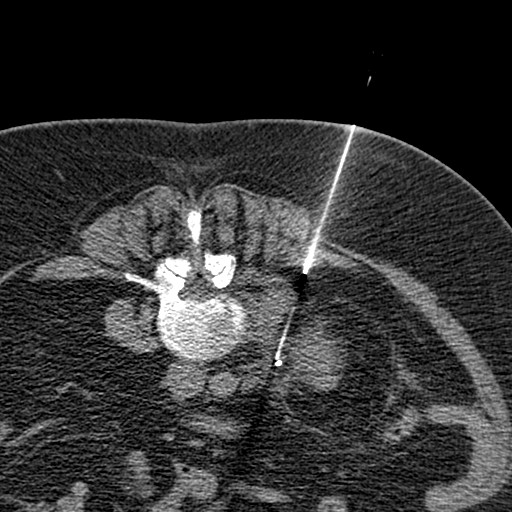
[im 39/48  soft-tissue]
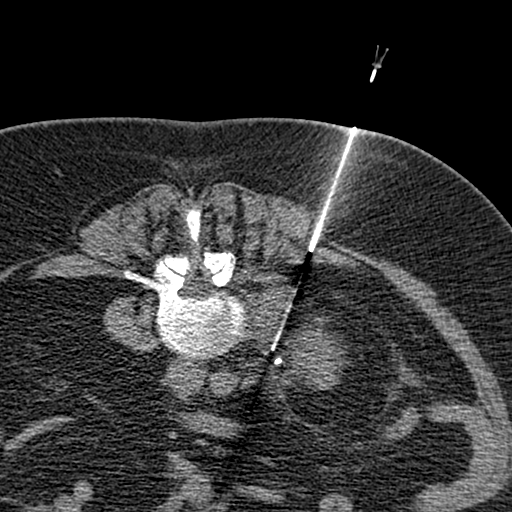
[im 43/48  soft-tissue]
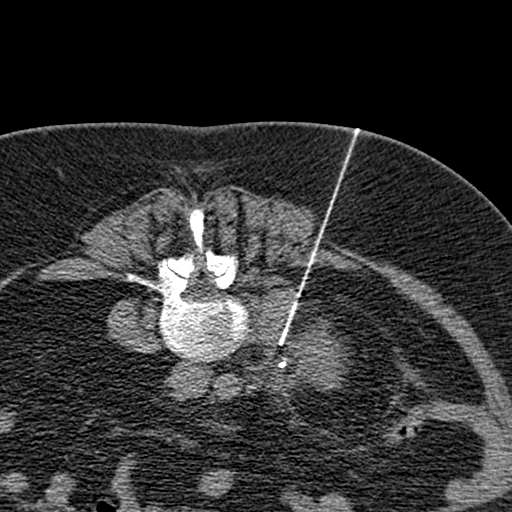

[Series 6: post · axial · 0.66mm/px · z∈[+1049,+1114]mm · 7 of 19 slices shown, 12 images]
[im 3/19  soft-tissue]
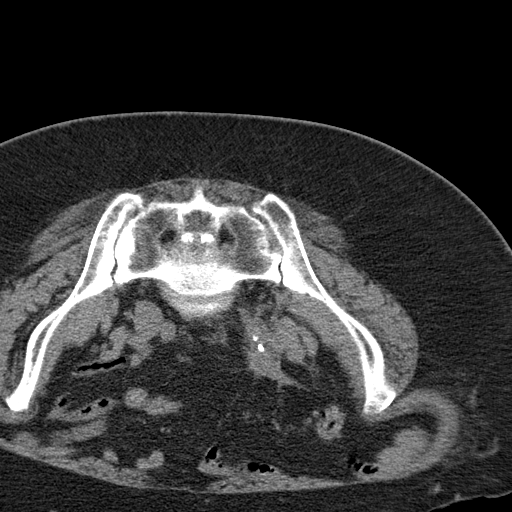
[im 3/19  bone]
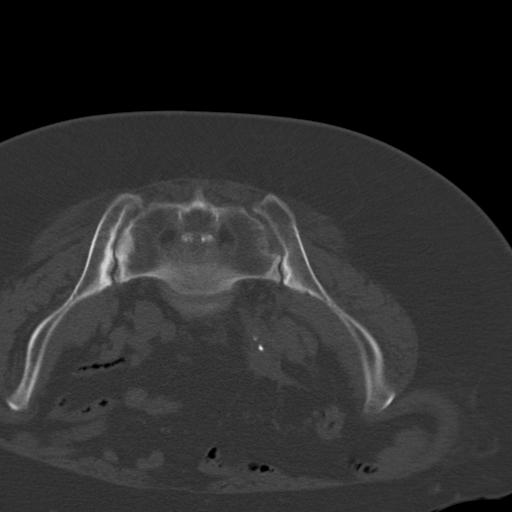
[im 5/19  soft-tissue]
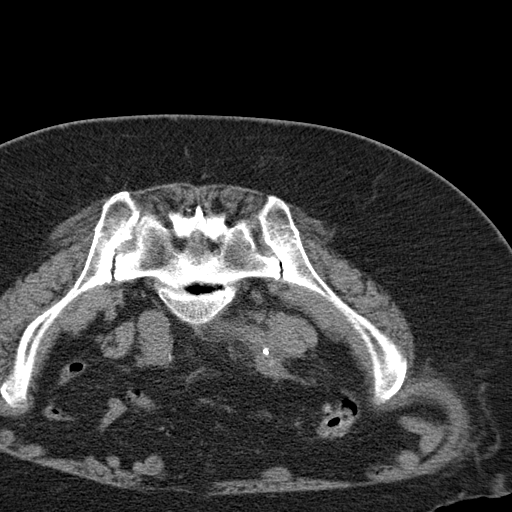
[im 7/19  soft-tissue]
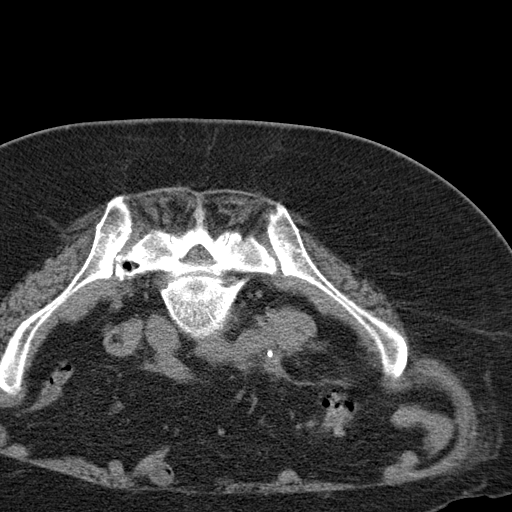
[im 10/19  soft-tissue]
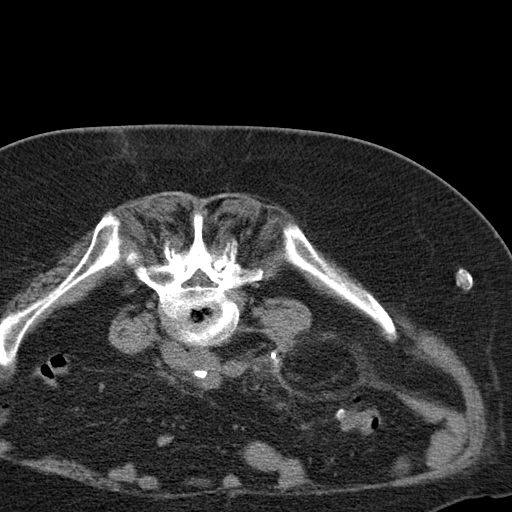
[im 10/19  lung]
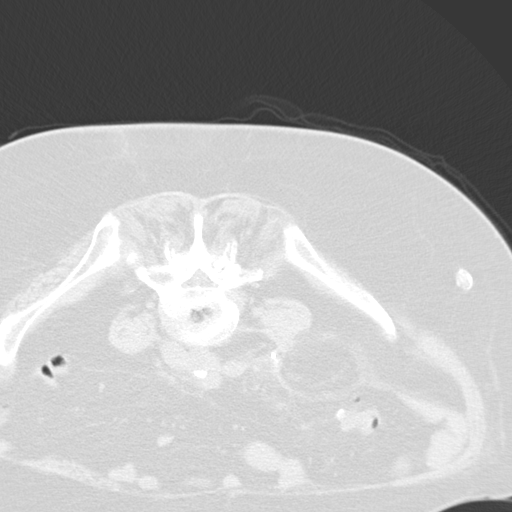
[im 12/19  soft-tissue]
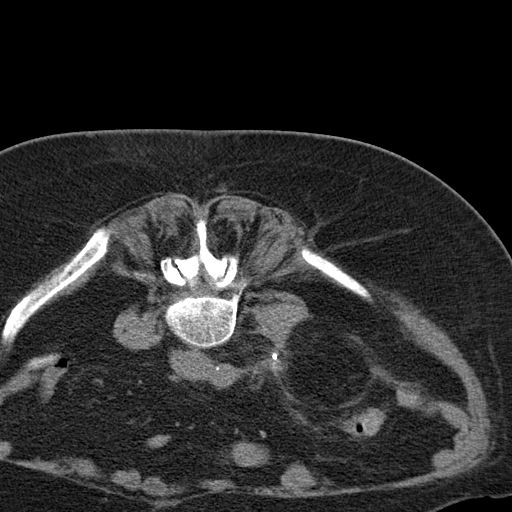
[im 12/19  lung]
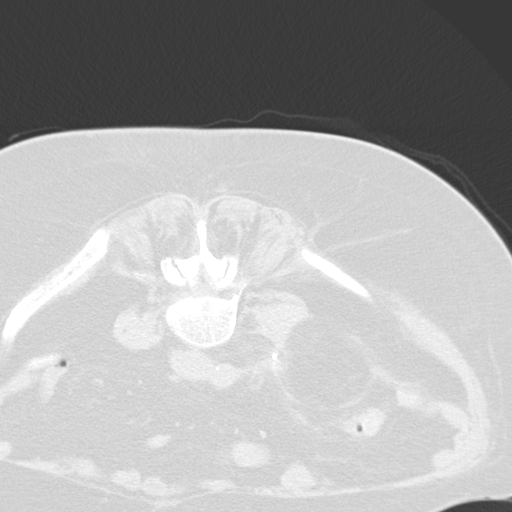
[im 14/19  soft-tissue]
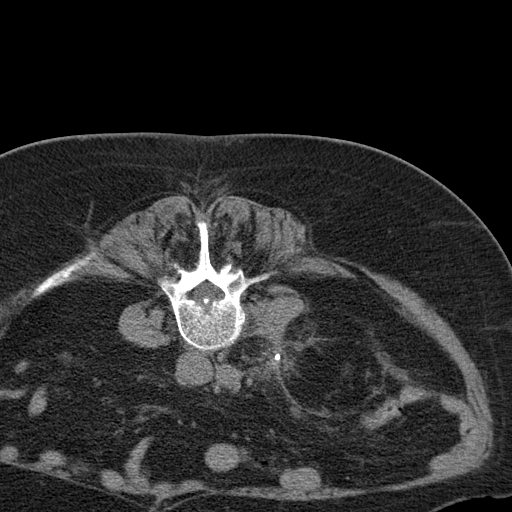
[im 14/19  lung]
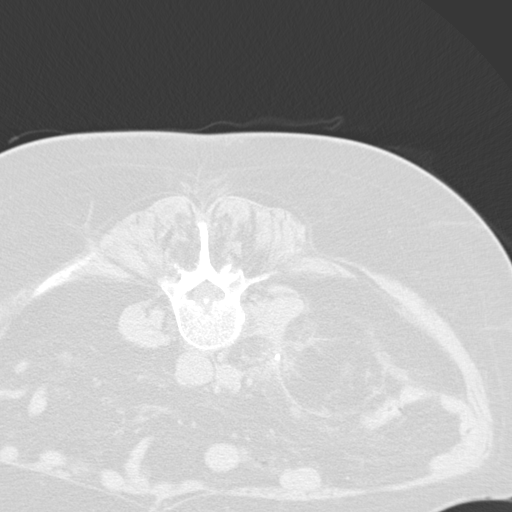
[im 16/19  soft-tissue]
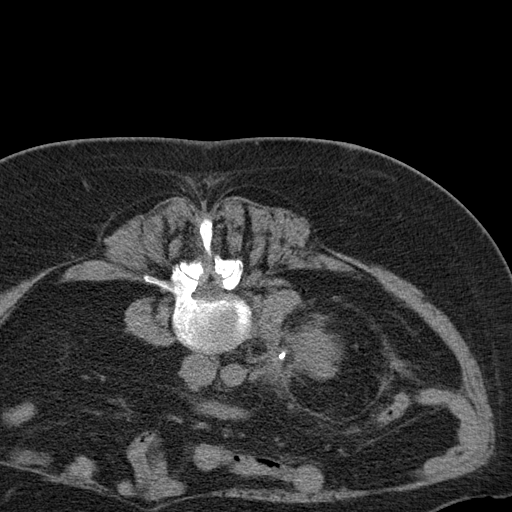
[im 16/19  lung]
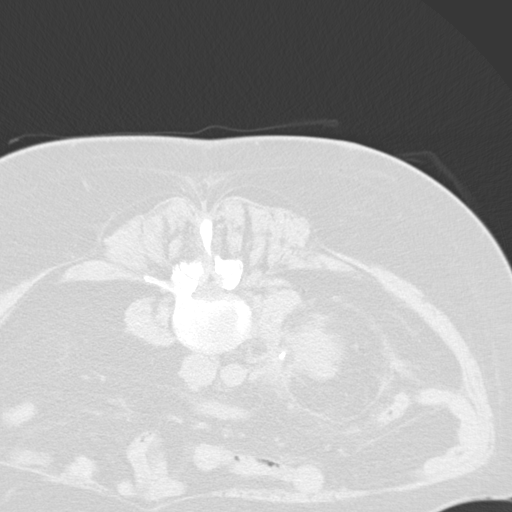

[15 of 32 positions shown; findings below may reference images not displayed]

EXAM:
CT GUIDED CORE NEEDLE BIOPSY OF LEFT PERI-URETERAL TISSUE

ANESTHESIA/SEDATION:
Two  Mg IV Versed; 100 mcg IV Fentanyl.

Total Moderate Sedation Time: 30 minutes.

PROCEDURE:
The procedure risks, benefits, and alternatives were explained to
the patient. Questions regarding the procedure were encouraged and
answered. The patient understands and consents to the procedure.

A planning axial CT scan was performed. A suitable skin entry site
was selected and marked. The region was then sterilely prepped and
draped in the usual fashion using Betadine skin prep. Local
anesthesia was attained by infiltration with 1% lidocaine. Using
intermittent CT fluoroscopic guidance, a 20 cm 17 gauge trocar
needle was carefully advanced to the margin of the soft tissue
medial to the well opacified ureteral stent. 18 gauge core biopsies
were then coaxially obtained using the [employer] automated biopsy
device. One well form solid core was obtained followed by several
small fragment tissue.

The trocar needle were removed. Post biopsy axial imaging
demonstrates No evidence of perinephric hemorrhage or other
complication.

Complications: Sedation lead to inadvertent deep sedation with
resultant respiratory depression and developing oxygen saturation.
Therefore, 0.4 mg of Narcan was administered intravenously. The
patient began breathing more regularly with less apnea and oxygen
saturations were easily maintain over 95%.
FINDINGS: Successful CT-guided core biopsy.
IMPRESSION: Successful CT-guided core biopsy of ill-defined left periureteral
soft tissue. The first biopsy core is intact and well formed.
However, sub scan cores were bloody with only small fragments of
tissue.

## 2015-07-24 IMAGING — RF DG RETROGRADE PYELOGRAM
1 series · 4 of 4 positions shown · non-contrast
Comparison: CT 03/17/2014

CLINICAL DATA: Left hydronephrosis.  Left ureter biopsy.

EXAM:
RETROGRADE PYELOGRAM

[Series 1: run · 4 of 4 slices shown]
[im 1/4]
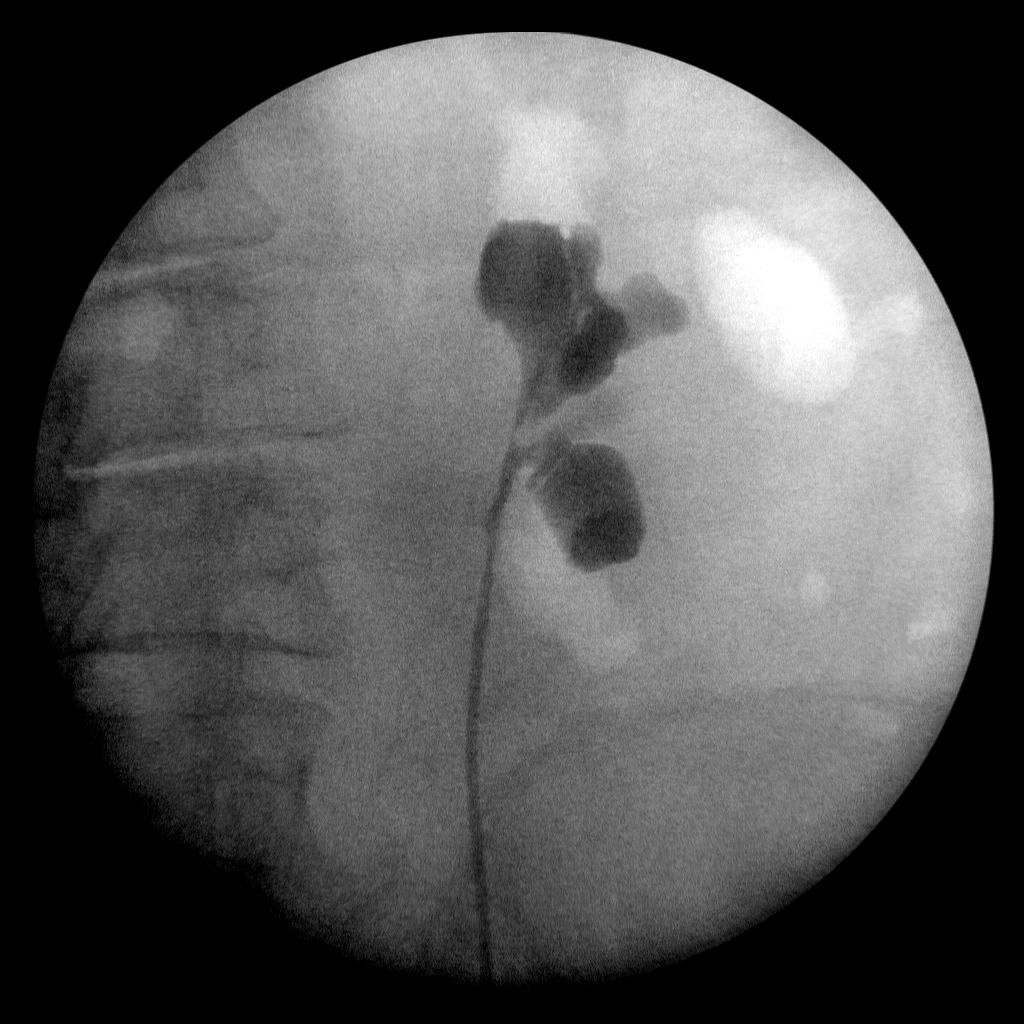
[im 2/4]
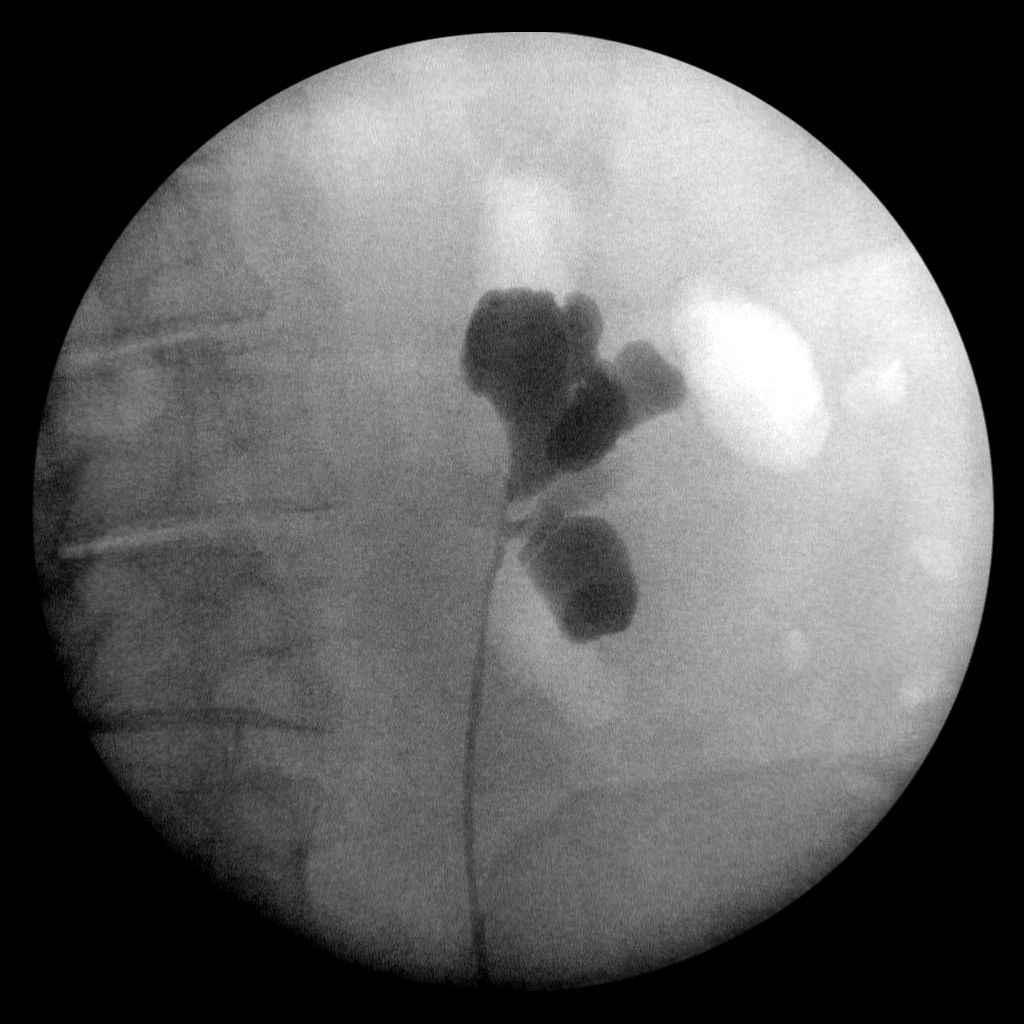
[im 3/4]
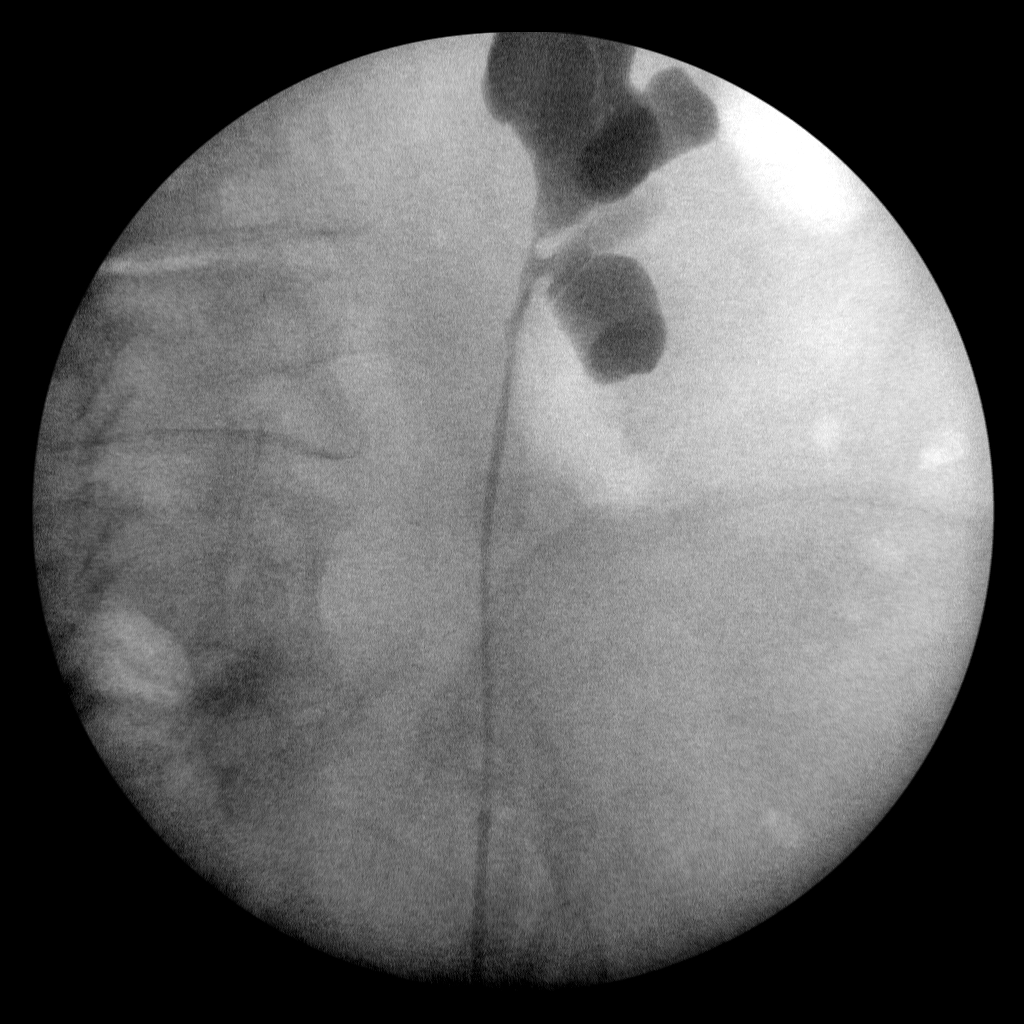
[im 4/4]
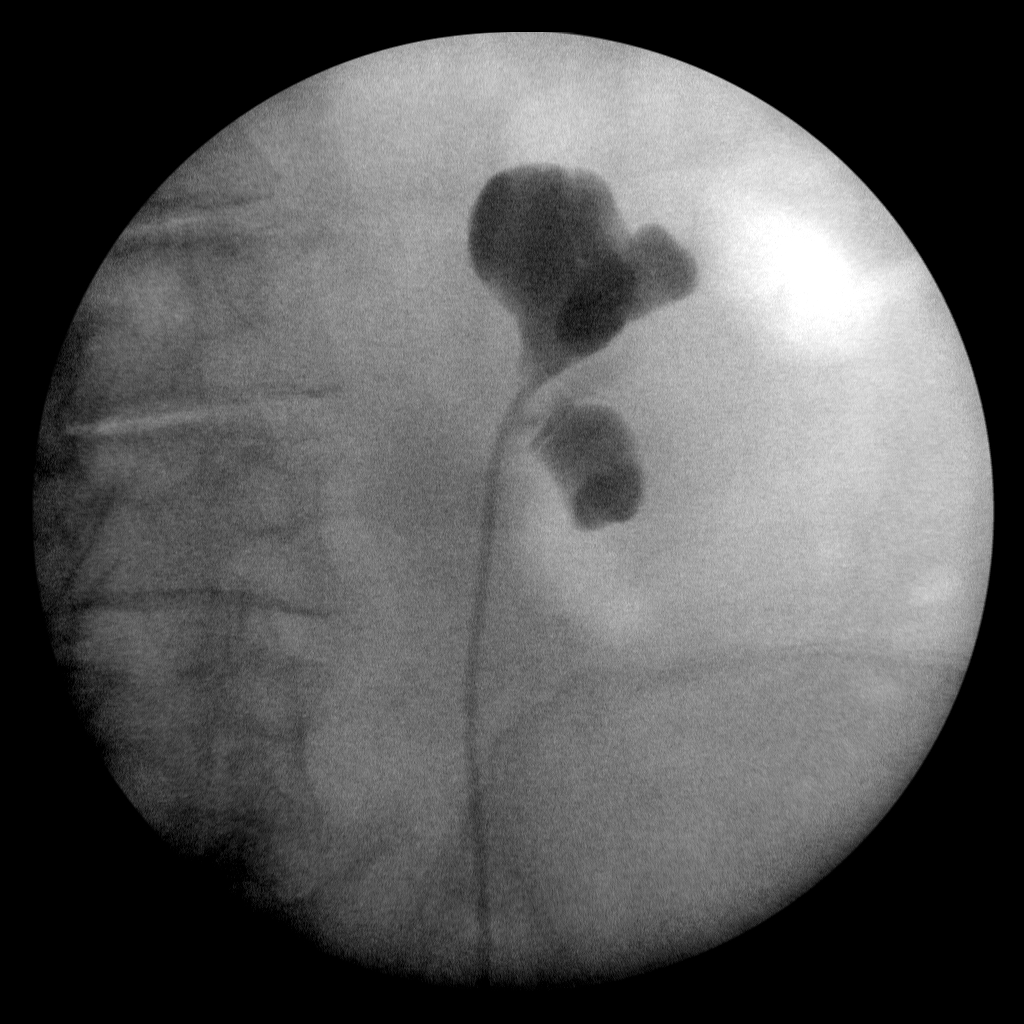

[4 of 4 positions shown; findings below may reference images not displayed]

FINDINGS: Four images were obtained of the left upper renal collecting system.
There is a catheter extending into the left renal pelvic region.
There is mild dilatation of the left renal calices. Left renal
pelvis is decompressed or small.
IMPRESSION: Mild dilatation of left renal calices. Left ureter catheter or
stent.

## 2015-08-15 IMAGING — DX DG ABD PORTABLE 1V
2 series · 2 of 2 positions shown · non-contrast
Comparison: CT 04/13/2014.  KUB [DATE] /2296.

CLINICAL DATA: Pain.

EXAM:
PORTABLE ABDOMEN - 1 VIEW

[abdomen kub (1 of 2)]
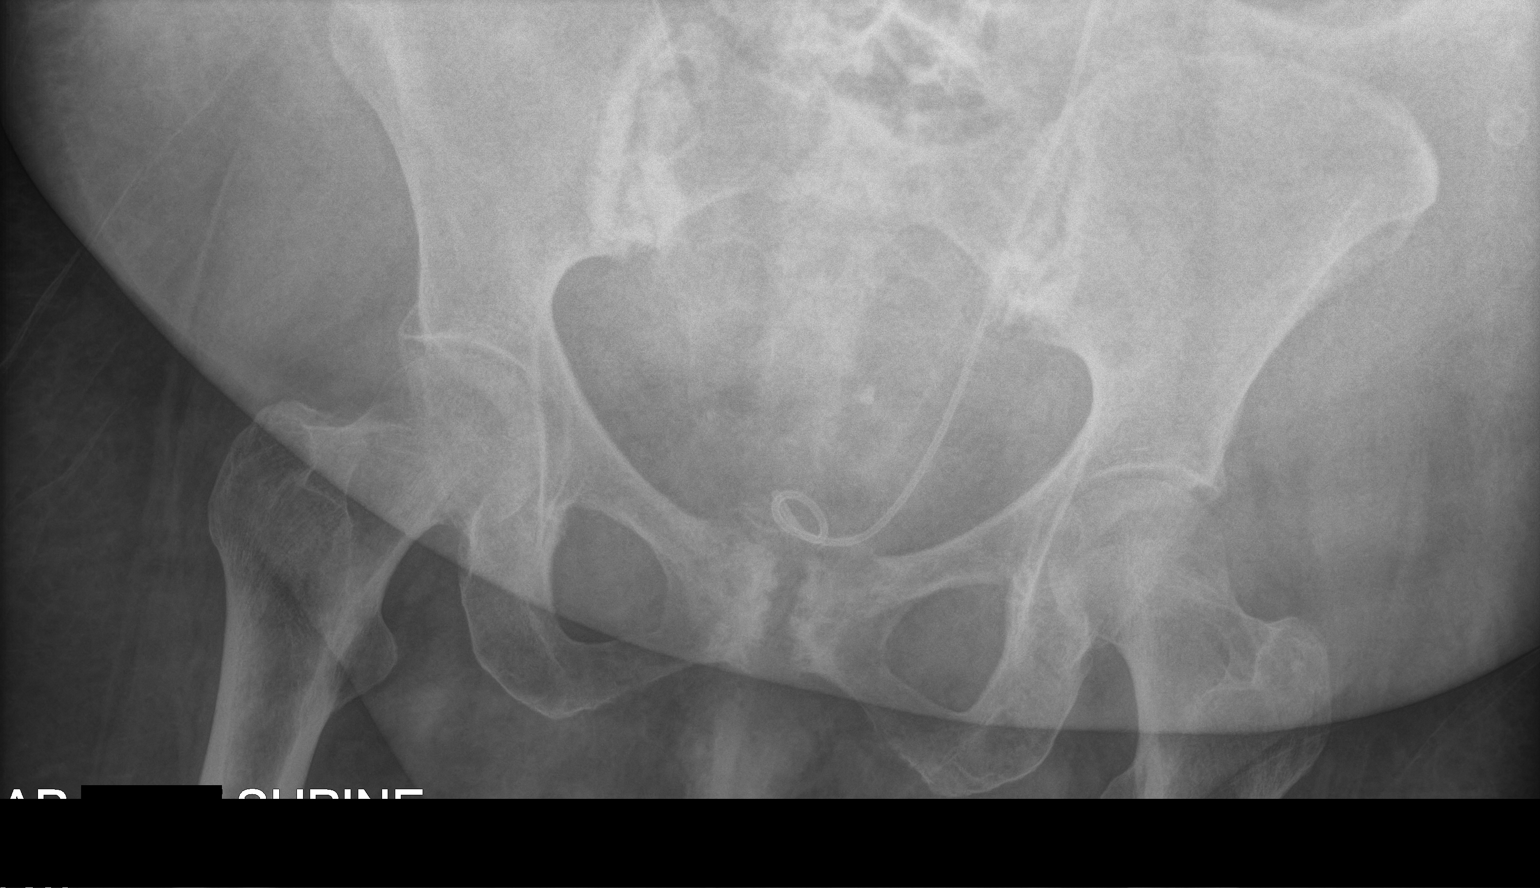

[abdomen kub (2 of 2)]
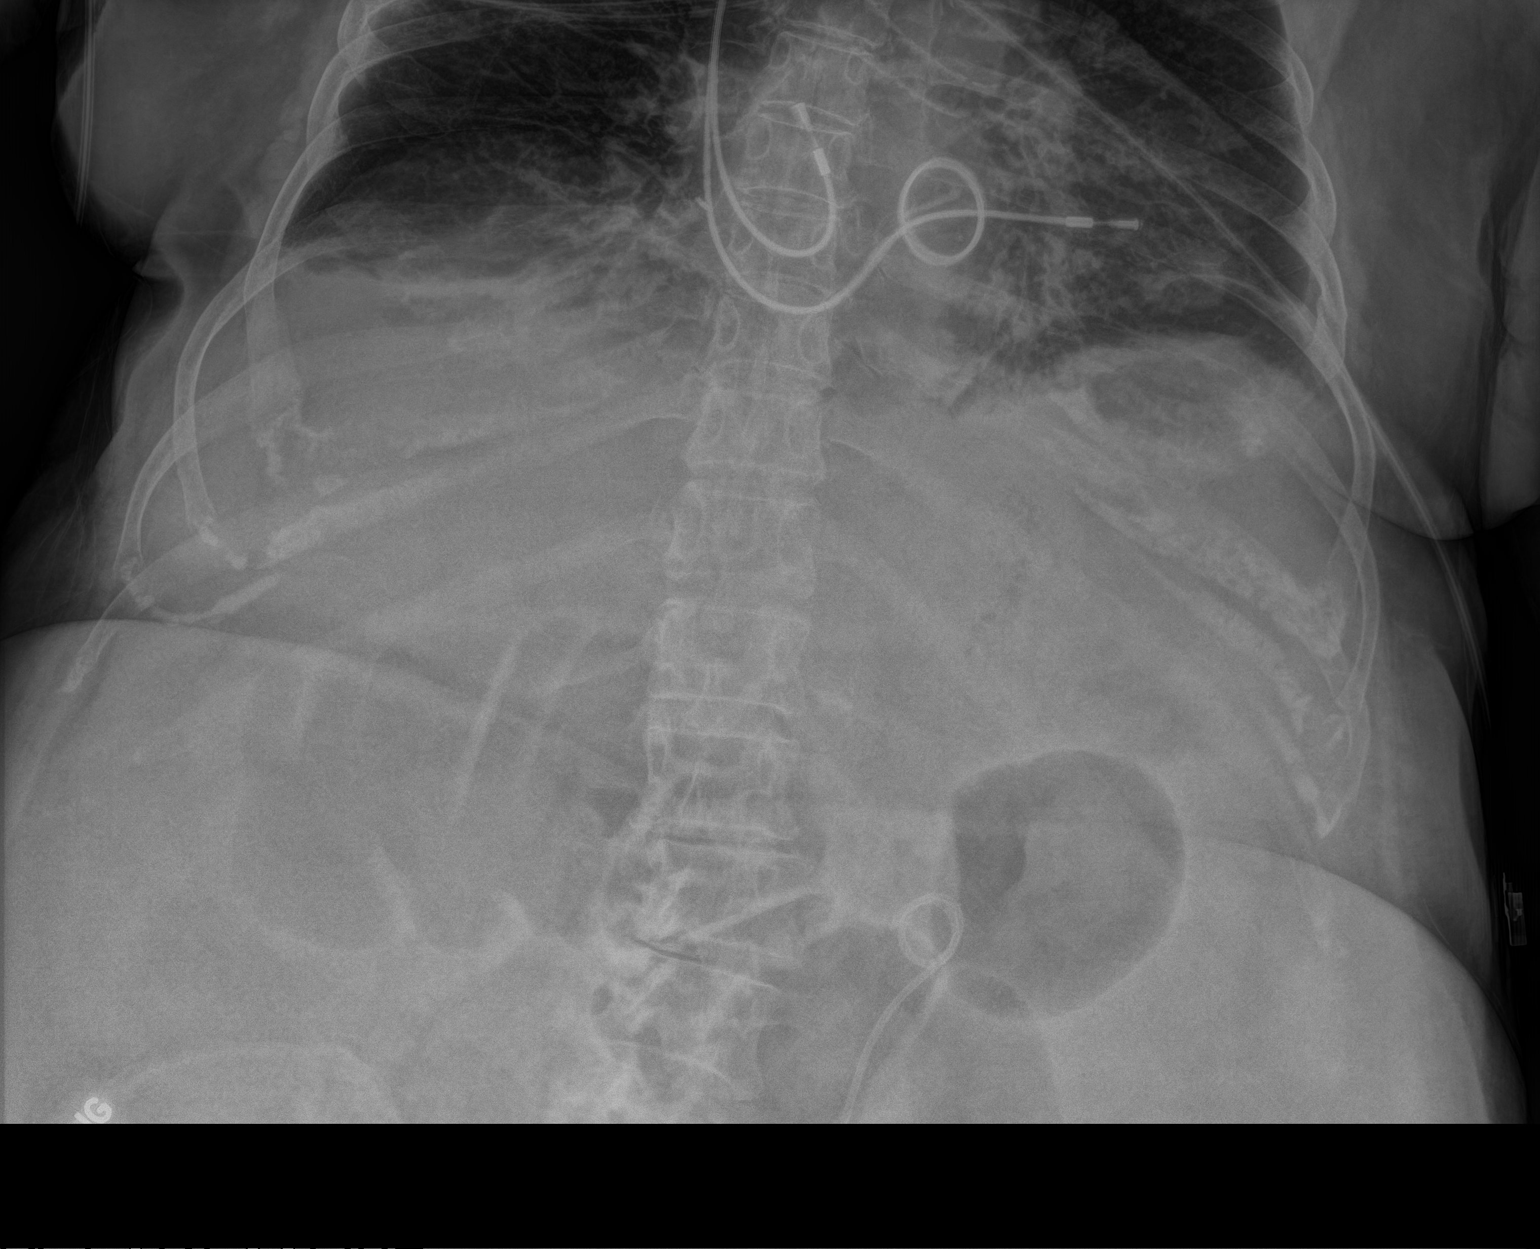

[2 of 2 positions shown; findings below may reference images not displayed]

FINDINGS: Soft tissue structures are unremarkable. Mild colonic distention
noted. No evidence of progressive bowel distention or free air.
Double-J ureteral stent noted on the left. Basilar atelectasis.
Cardiac pacing leads. Cardiac valve replacement. Changes of
bilateral sacroiliitis noted.
IMPRESSION: 1. Mild colonic distention noted. No evidence of progressive colonic
distention or free air.
2. Double-J left ureteral stent in good anatomic position.
3. Bilateral sacroiliitis.
4. Bibasilar atelectasis.
5. Cardiac pacer and cardiac valve replacement.

## 2015-08-16 IMAGING — US US PARACENTESIS
1 series · 7 of 7 positions shown · non-contrast
Comparison: None.

CLINICAL DATA: Patient with history of transitional cell carcinoma
of the left ureter, renal failure, ascites. Request is made for
diagnostic and therapeutic paracentesis up to 3 liters.

EXAM:
ULTRASOUND GUIDED DIAGNOSTIC AND THERAPEUTIC PARACENTESIS

[Series 1: us paracentesis · 0.26mm/px · 7 of 7 slices shown]
[im 1/7]
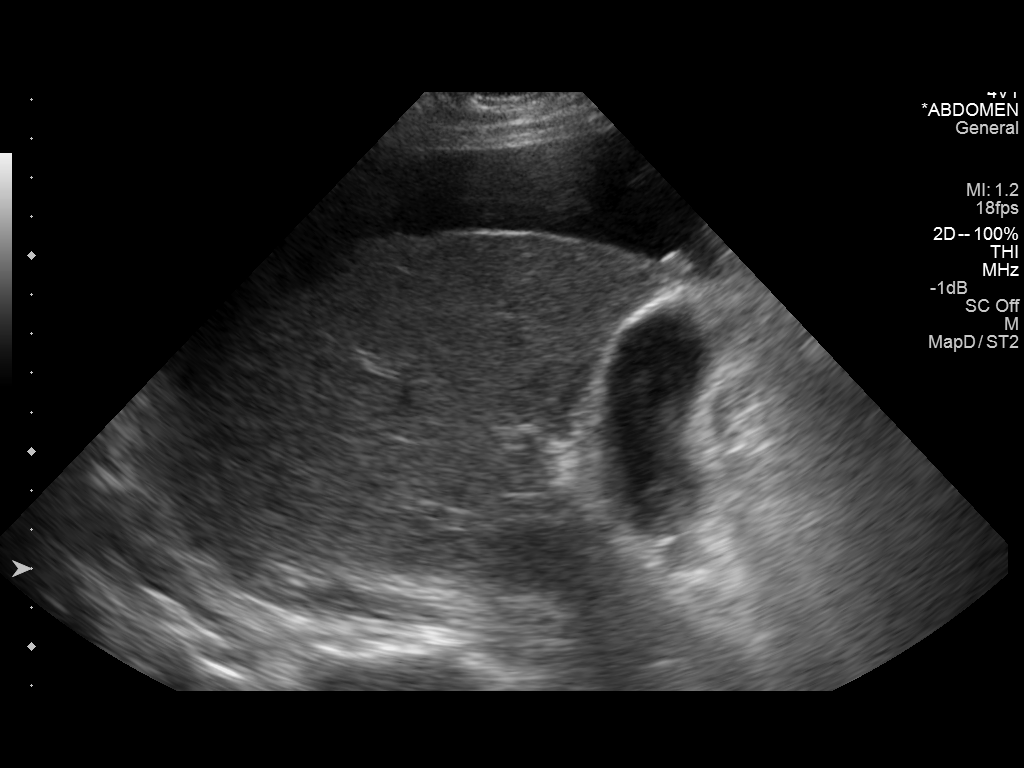
[im 2/7]
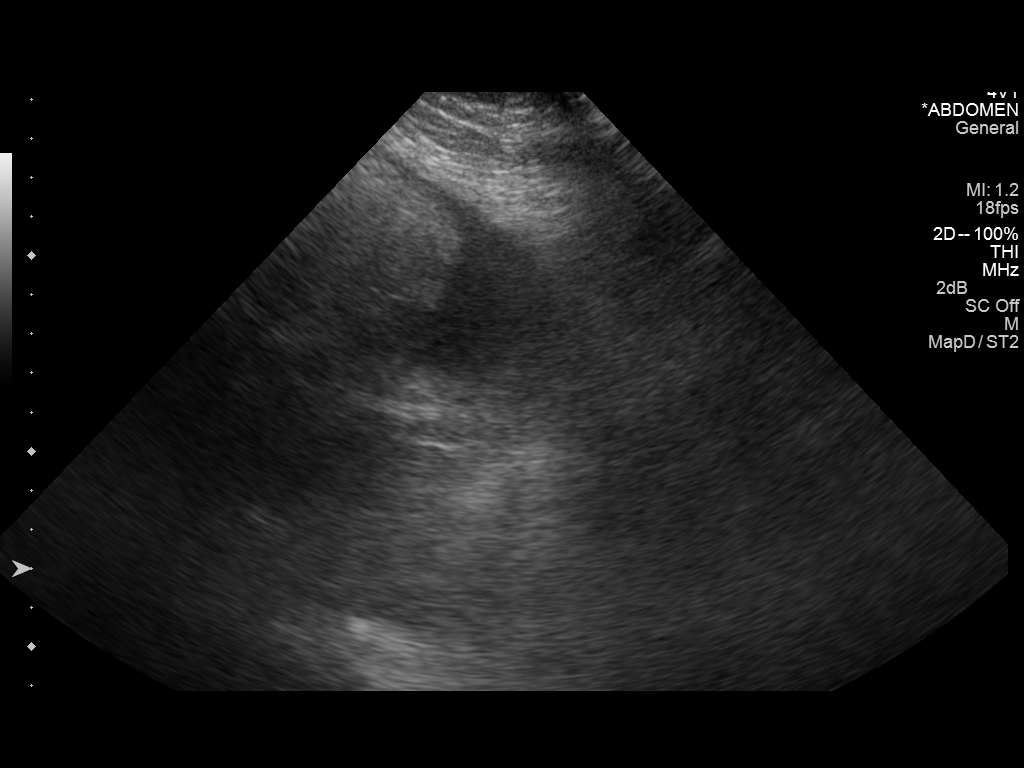
[im 3/7]
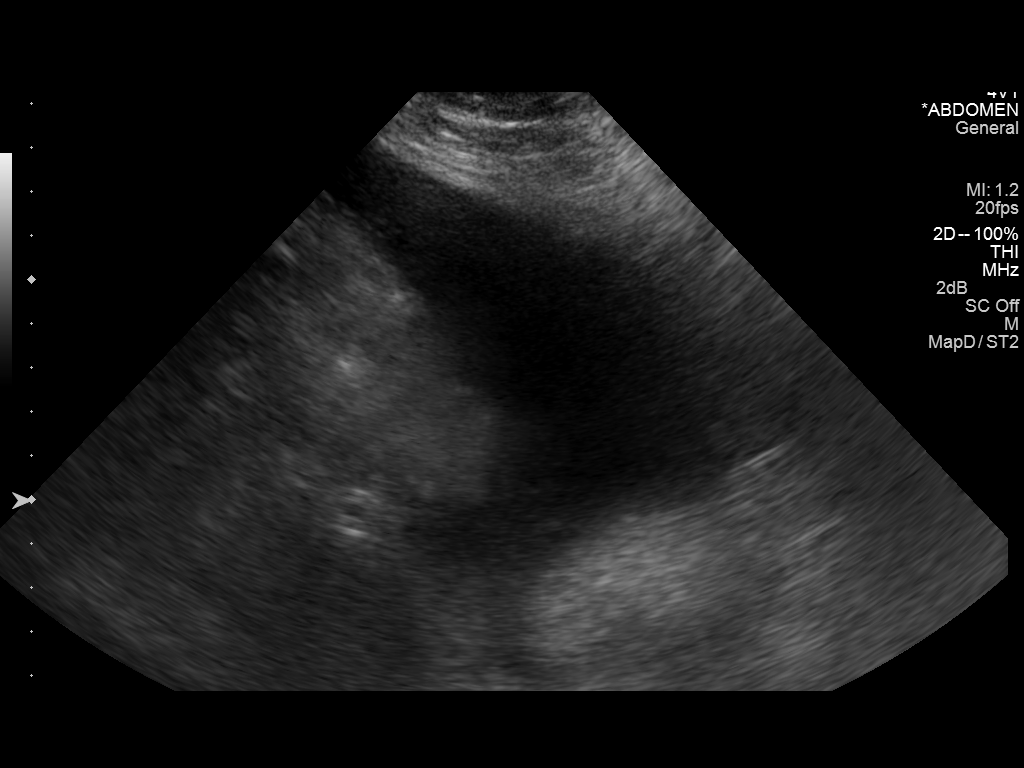
[im 4/7]
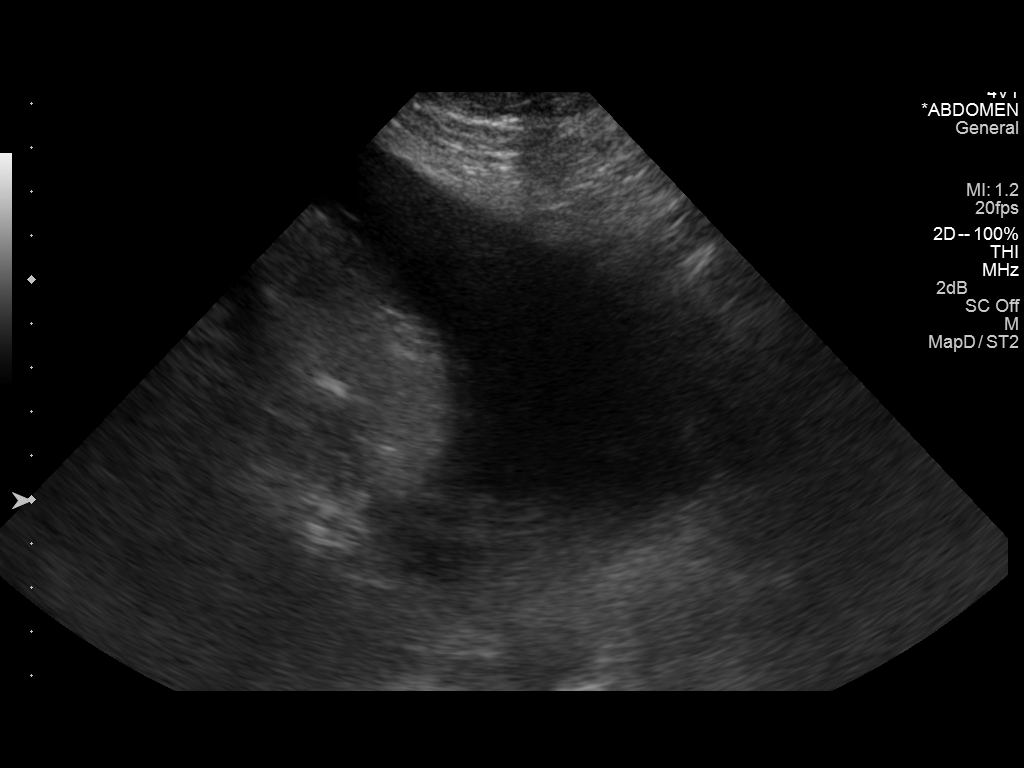
[im 5/7]
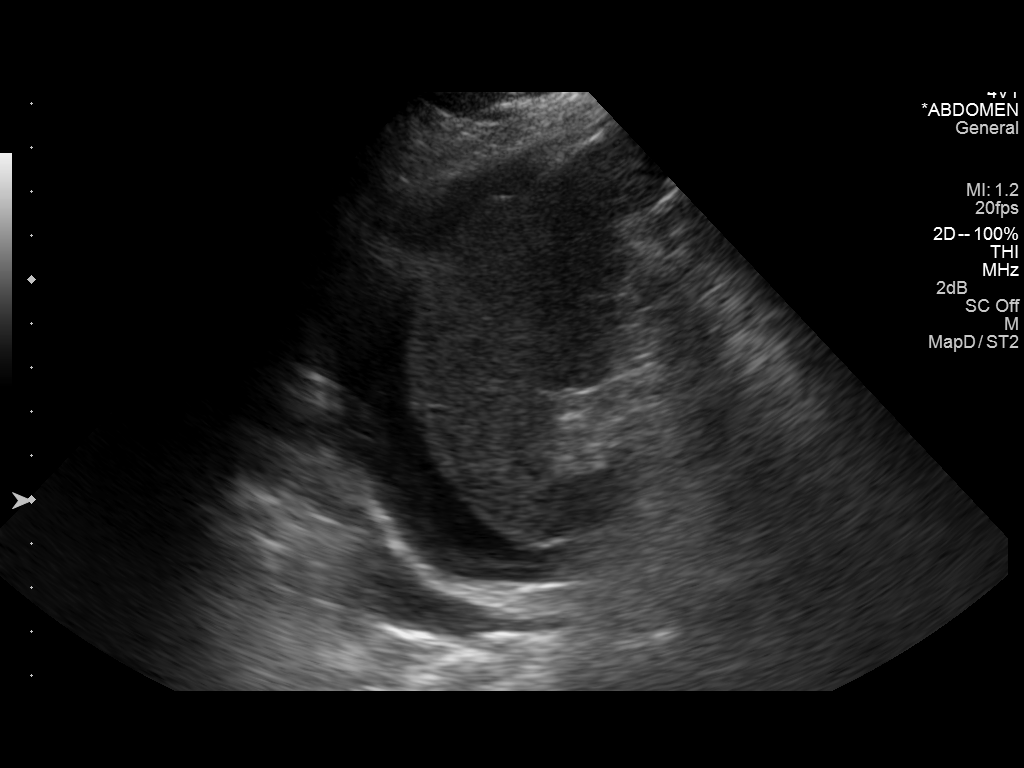
[im 6/7]
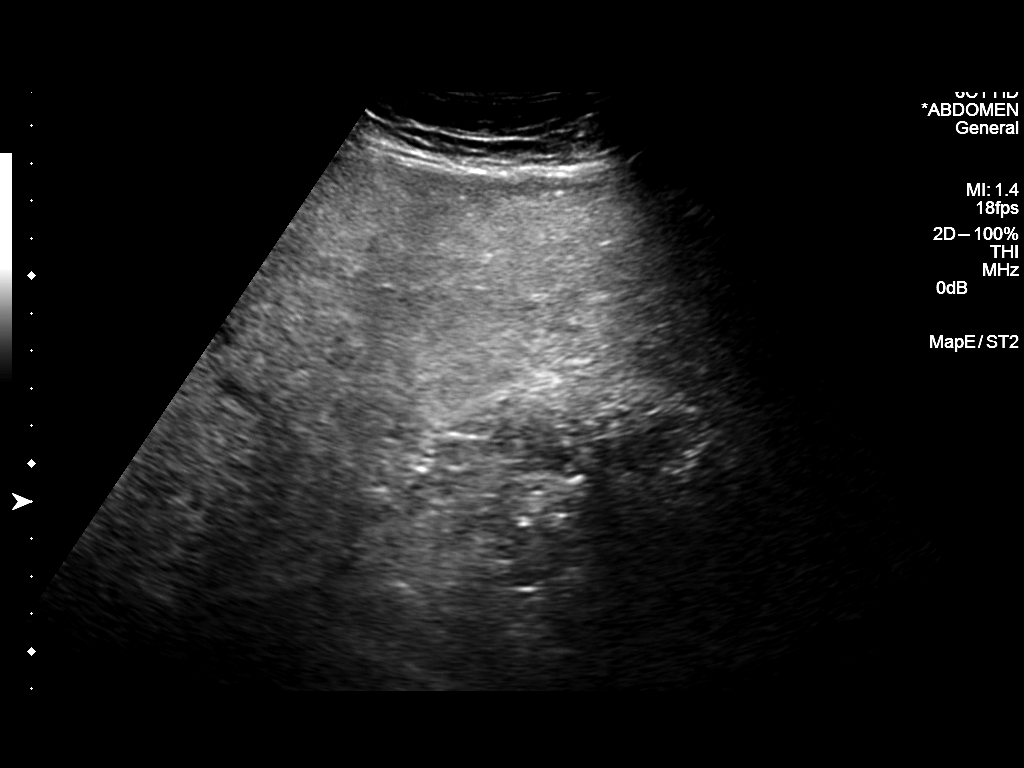
[im 7/7]
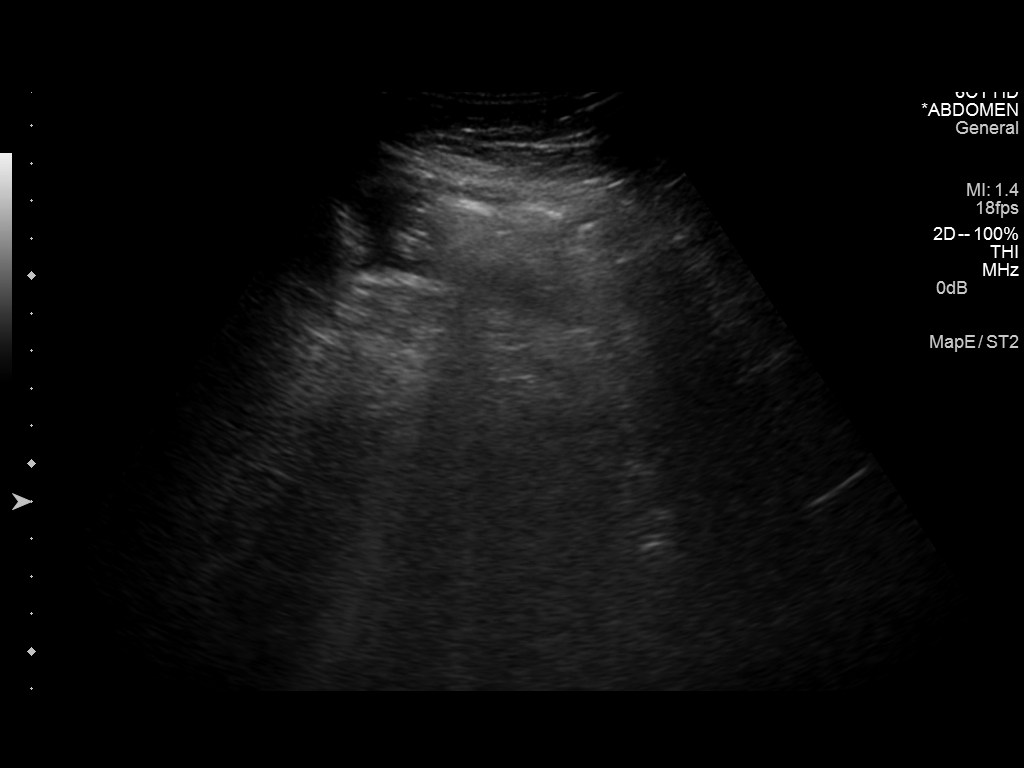

[7 of 7 positions shown; findings below may reference images not displayed]

PROCEDURE:
An ultrasound guided paracentesis was thoroughly discussed with the
patient and questions answered. The benefits, risks, alternatives
and complications were also discussed. The patient understands and
wishes to proceed with the procedure. Written consent was obtained.

Ultrasound was performed to localize and mark an adequate pocket of
fluid in the left lower quadrant of the abdomen. The area was then
prepped and draped in the normal sterile fashion. 1% Lidocaine was
used for local anesthesia. Under ultrasound guidance a 19 gauge Yueh
catheter was introduced. Paracentesis was performed. The catheter
was removed and a dressing applied.

Complications: None.
FINDINGS: A total of approximately 2.8 liters of blood-tinged fluid was
removed. A fluid sample was sent for laboratory analysis.
IMPRESSION: Successful ultrasound guided diagnostic and therapeutic paracentesis
yielding 2.8 liters of ascites.

## 2015-08-16 IMAGING — US US RENAL
1 series · 14 of 21 positions shown · non-contrast
Comparison: CT abdomen and pelvis 04/13/2014

CLINICAL DATA: Hydronephrosis

EXAM:
RENAL/URINARY TRACT ULTRASOUND COMPLETE

[Series 1: us renal · 0.27mm/px · 14 of 21 slices shown]
[im 1/21]
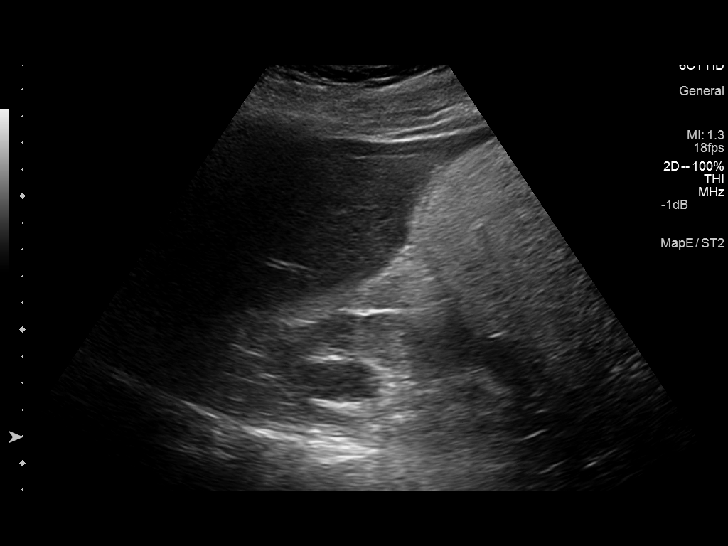
[im 3/21]
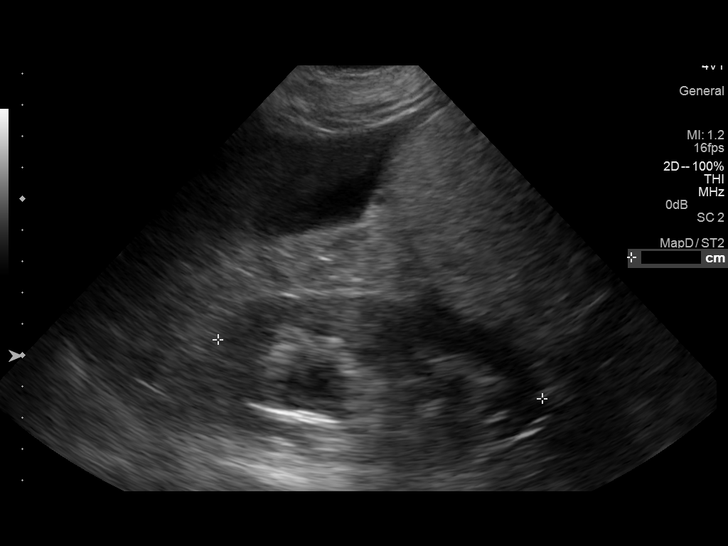
[im 4/21]
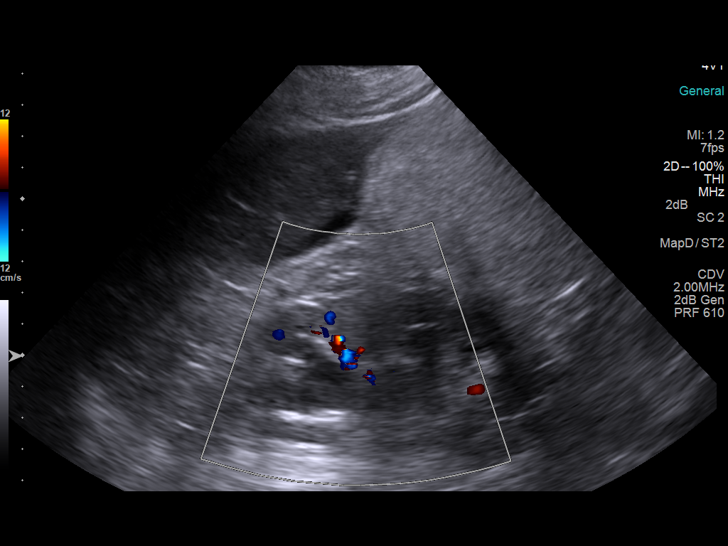
[im 6/21]
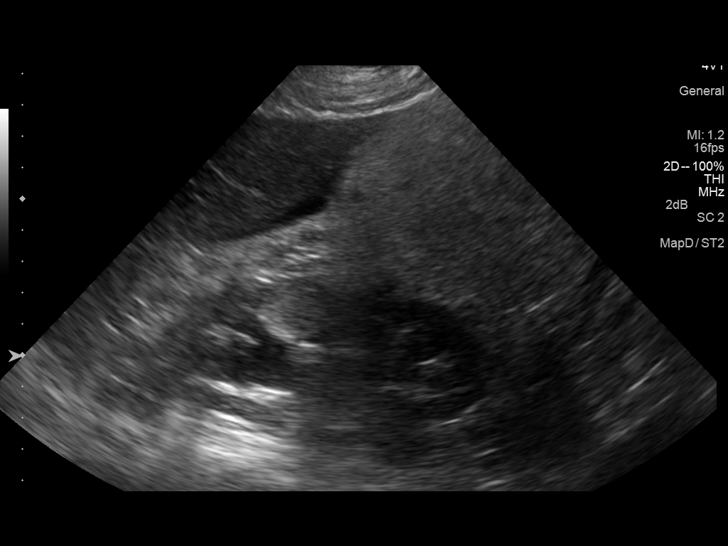
[im 7/21]
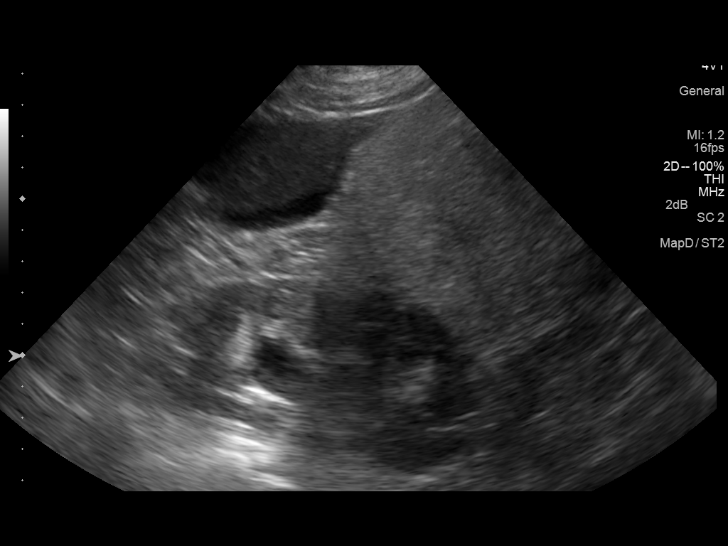
[im 9/21]
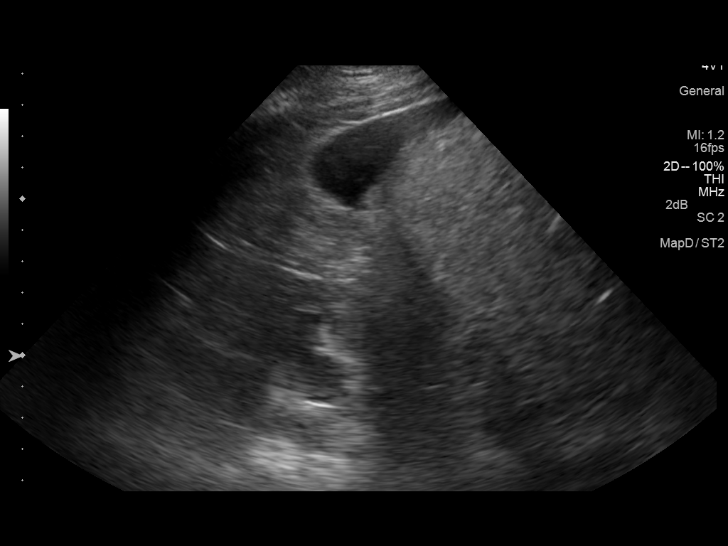
[im 10/21]
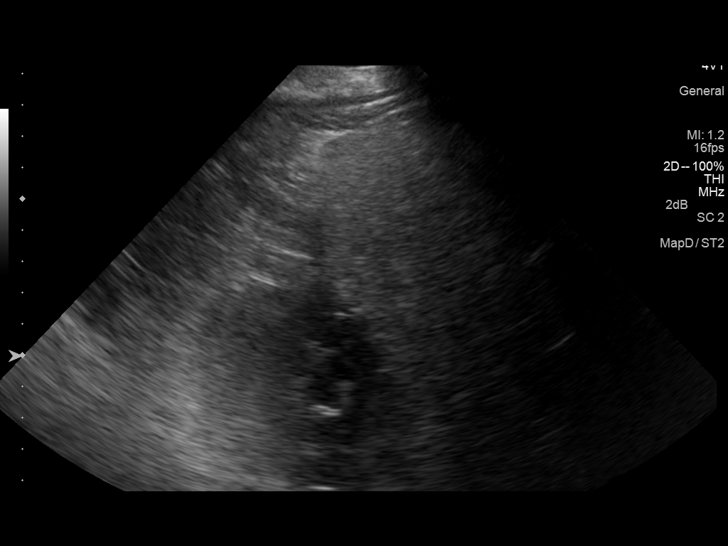
[im 12/21]
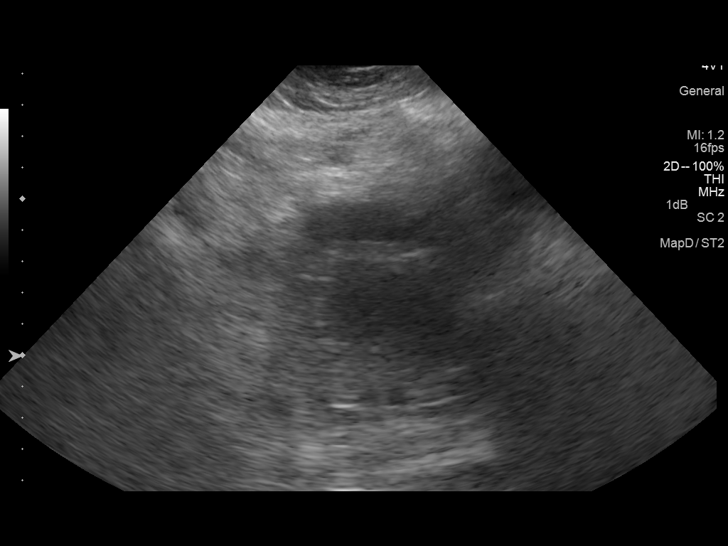
[im 13/21]
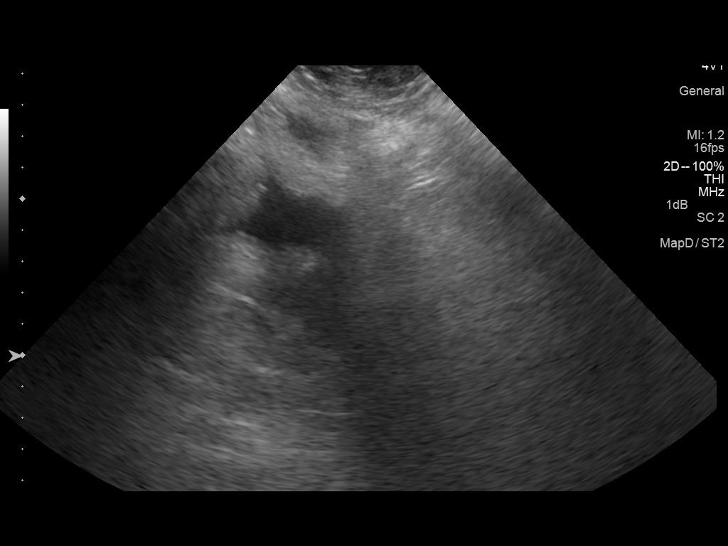
[im 15/21]
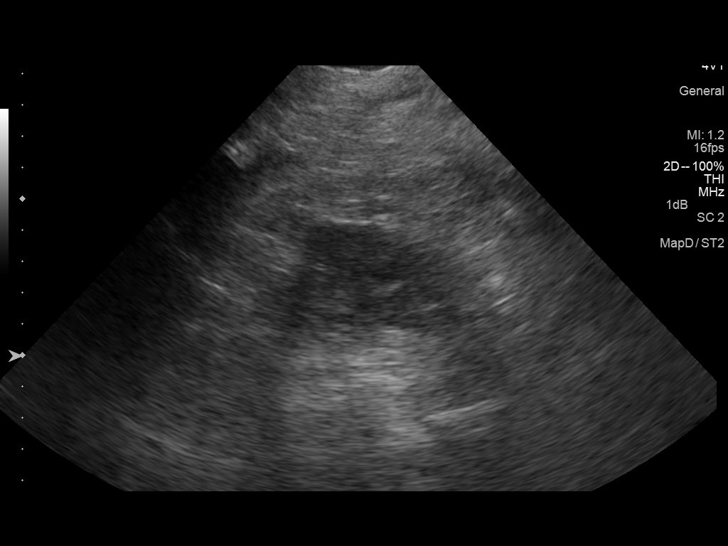
[im 16/21]
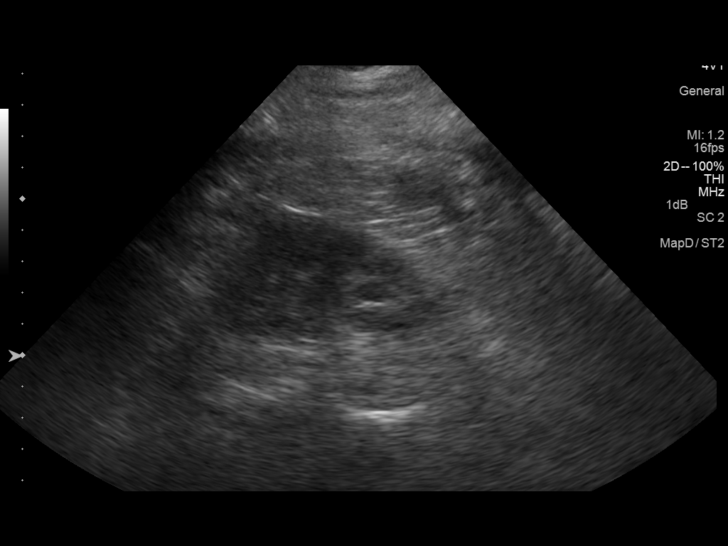
[im 18/21]
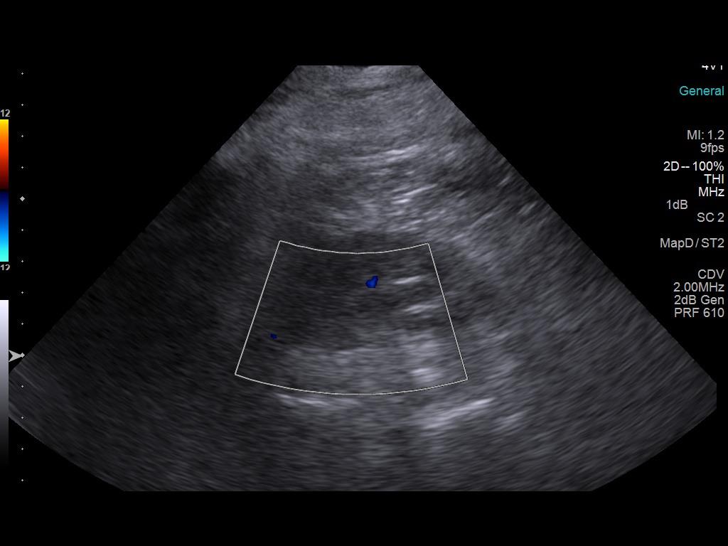
[im 19/21]
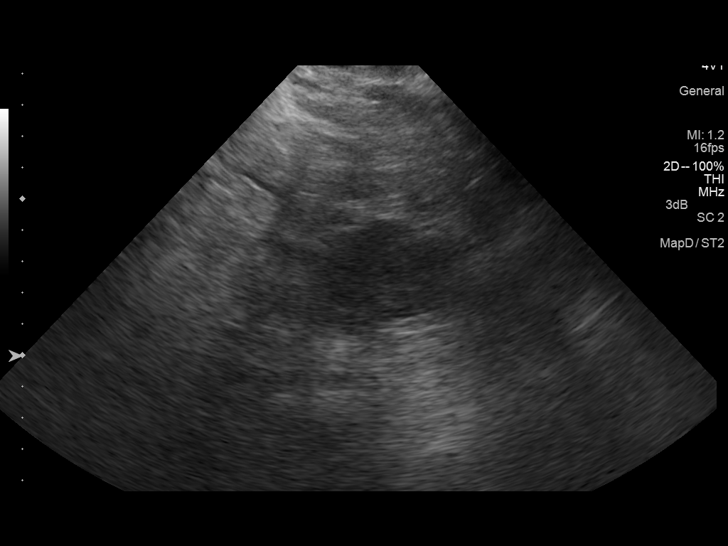
[im 21/21]
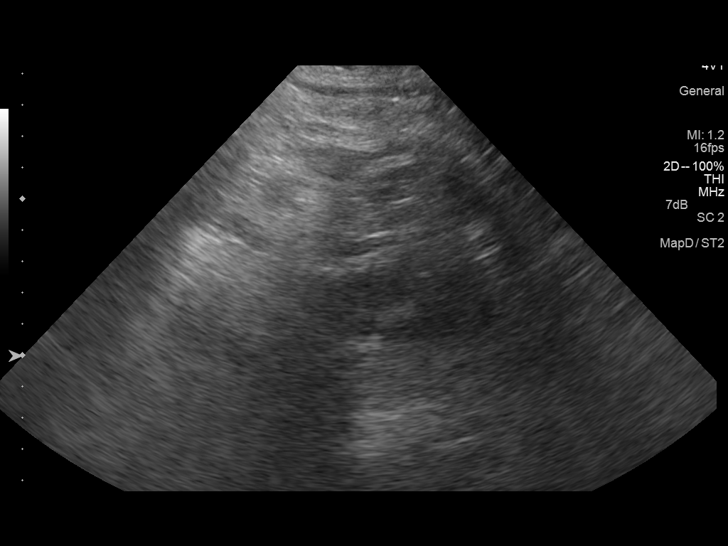

[14 of 21 positions shown; findings below may reference images not displayed]

FINDINGS: Right Kidney:

Length: 10.5 cm. Cortical thinning. Upper normal cortical
echogenicity. Mild hydronephrosis similar to prior CT. No mass or
shadowing calcification.

Left Kidney:

Length: 7.4 cm. Atrophic appearance. Minimal collecting system
dilatation. Linear echogenicity at inferior pole likely reflects
indwelling LEFT ureteral stent seen on prior CT. No mass or
shadowing calcification.

Bladder:

Decompressed by Foley catheter, not adequately visualized.

Ascites noted.

Overall image quality degraded secondary to body habitus.
IMPRESSION: Atrophic LEFT kidney with minimal collecting system dilatation and
only partial visualization of the LEFT ureteral stent seen on CT.

Mild RIGHT hydronephrosis similar to prior CT.

Ascites.
